# Patient Record
Sex: Male | Born: 1970
Health system: Southern US, Community
[De-identification: ages and names within clinical notes are randomized; demographics above are authoritative.]

## PROBLEM LIST (undated history)

## (undated) DIAGNOSIS — F419 Anxiety disorder, unspecified: Secondary | ICD-10-CM

## (undated) DIAGNOSIS — M1990 Unspecified osteoarthritis, unspecified site: Secondary | ICD-10-CM

## (undated) DIAGNOSIS — K5792 Diverticulitis of intestine, part unspecified, without perforation or abscess without bleeding: Secondary | ICD-10-CM

## (undated) DIAGNOSIS — I10 Essential (primary) hypertension: Secondary | ICD-10-CM

## (undated) DIAGNOSIS — K219 Gastro-esophageal reflux disease without esophagitis: Secondary | ICD-10-CM

## (undated) HISTORY — PX: KNEE SURGERY: SHX244

## (undated) HISTORY — PX: TONSILLECTOMY: SUR1361

## (undated) HISTORY — PX: BACK SURGERY: SHX140

## (undated) HISTORY — PX: SPINAL FUSION: SHX223

---

## 2001-01-13 ENCOUNTER — Emergency Department (HOSPITAL_COMMUNITY): Admission: EM | Admit: 2001-01-13 | Discharge: 2001-01-13 | Payer: Self-pay | Admitting: Emergency Medicine

## 2001-08-29 ENCOUNTER — Encounter: Payer: Self-pay | Admitting: Internal Medicine

## 2001-08-29 ENCOUNTER — Inpatient Hospital Stay (HOSPITAL_COMMUNITY): Admission: EM | Admit: 2001-08-29 | Discharge: 2001-09-01 | Payer: Self-pay | Admitting: Emergency Medicine

## 2001-08-31 ENCOUNTER — Encounter: Payer: Self-pay | Admitting: Internal Medicine

## 2001-08-31 ENCOUNTER — Encounter: Payer: Self-pay | Admitting: Geriatric Medicine

## 2002-09-18 HISTORY — PX: NASAL SEPTUM SURGERY: SHX37

## 2003-04-06 ENCOUNTER — Encounter: Payer: Self-pay | Admitting: Emergency Medicine

## 2003-04-07 ENCOUNTER — Inpatient Hospital Stay (HOSPITAL_COMMUNITY): Admission: EM | Admit: 2003-04-07 | Discharge: 2003-04-13 | Payer: Self-pay | Admitting: Psychiatry

## 2003-04-14 ENCOUNTER — Other Ambulatory Visit (HOSPITAL_COMMUNITY): Admission: RE | Admit: 2003-04-14 | Discharge: 2003-05-05 | Payer: Self-pay | Admitting: Psychiatry

## 2004-06-09 ENCOUNTER — Emergency Department (HOSPITAL_COMMUNITY): Admission: EM | Admit: 2004-06-09 | Discharge: 2004-06-10 | Payer: Self-pay | Admitting: Emergency Medicine

## 2004-06-12 ENCOUNTER — Emergency Department (HOSPITAL_COMMUNITY): Admission: EM | Admit: 2004-06-12 | Discharge: 2004-06-12 | Payer: Self-pay | Admitting: Emergency Medicine

## 2004-11-16 ENCOUNTER — Inpatient Hospital Stay (HOSPITAL_COMMUNITY): Admission: RE | Admit: 2004-11-16 | Discharge: 2004-11-18 | Payer: Self-pay | Admitting: Neurosurgery

## 2005-01-16 ENCOUNTER — Ambulatory Visit (HOSPITAL_BASED_OUTPATIENT_CLINIC_OR_DEPARTMENT_OTHER): Admission: RE | Admit: 2005-01-16 | Discharge: 2005-01-16 | Payer: Self-pay | Admitting: Urology

## 2005-01-23 ENCOUNTER — Inpatient Hospital Stay (HOSPITAL_COMMUNITY): Admission: EM | Admit: 2005-01-23 | Discharge: 2005-01-25 | Payer: Self-pay | Admitting: Urology

## 2006-05-25 ENCOUNTER — Encounter: Admission: RE | Admit: 2006-05-25 | Discharge: 2006-05-25 | Payer: Self-pay | Admitting: Neurosurgery

## 2007-11-21 ENCOUNTER — Emergency Department (HOSPITAL_COMMUNITY): Admission: EM | Admit: 2007-11-21 | Discharge: 2007-11-21 | Payer: Self-pay | Admitting: Emergency Medicine

## 2008-07-22 ENCOUNTER — Ambulatory Visit (HOSPITAL_BASED_OUTPATIENT_CLINIC_OR_DEPARTMENT_OTHER): Admission: RE | Admit: 2008-07-22 | Discharge: 2008-07-22 | Payer: Self-pay | Admitting: Orthopedic Surgery

## 2008-10-31 ENCOUNTER — Emergency Department (HOSPITAL_COMMUNITY): Admission: EM | Admit: 2008-10-31 | Discharge: 2008-10-31 | Payer: Self-pay | Admitting: Emergency Medicine

## 2009-03-05 ENCOUNTER — Encounter: Admission: RE | Admit: 2009-03-05 | Discharge: 2009-03-05 | Payer: Self-pay | Admitting: Internal Medicine

## 2009-08-11 ENCOUNTER — Encounter: Admission: RE | Admit: 2009-08-11 | Discharge: 2009-08-11 | Payer: Self-pay | Admitting: Internal Medicine

## 2009-08-20 ENCOUNTER — Encounter: Admission: RE | Admit: 2009-08-20 | Discharge: 2009-08-20 | Payer: Self-pay | Admitting: Internal Medicine

## 2010-03-14 ENCOUNTER — Emergency Department (HOSPITAL_COMMUNITY): Admission: EM | Admit: 2010-03-14 | Discharge: 2010-03-15 | Payer: Self-pay | Admitting: Emergency Medicine

## 2010-10-09 ENCOUNTER — Encounter: Payer: Self-pay | Admitting: Neurosurgery

## 2010-12-04 LAB — RAPID STREP SCREEN (MED CTR MEBANE ONLY): Streptococcus, Group A Screen (Direct): NEGATIVE

## 2011-01-31 NOTE — Op Note (Signed)
NAME:  Kenneth [name], Kenneth [name]                  ACCOUNT NO.:  [id number]   MEDICAL RECORD NO.:  [id number]          PATIENT TYPE:  AMB   LOCATION:  DSC                          FACILITY:  [facility]   PHYSICIAN:  [name], M.D. DATE OF BIRTH:  1971-03-19   DATE OF PROCEDURE:  07/22/2008  DATE OF DISCHARGE:                               OPERATIVE REPORT   PREOPERATIVE DIAGNOSIS:  Left knee medial meniscus tear.   POSTOPERATIVE DIAGNOSIS:  Left knee medial meniscus tear with some grade  2 fissuring lateral tibial plateau.   PROCEDURE:  Left knee exam under anesthesia, arthroscopy, partial medial  meniscectomy, chondroplasty of the lateral tibial plateau.   SURGEON:  [name], MD   ASSISTANT:  Genene Churn. Barry Dienes, [place]   ANESTHESIA:  Knee block with sedation.   SPECIMENS:  None.   CULTURES:  None.   COMPLICATIONS:  None.   DRESSINGS:  Soft compressive.   TOURNIQUET:  None applied.   PROCEDURE IN DETAIL:  The patient brought to the operating room, placed  on the operating table in supine position.  After adequate anesthesia  has been obtained, tourniquet and leg holder applied.  Leg prepped and  draped in usual sterile fashion.  Three portals were created; one  superolateral, one each medial and lateral parapatellar.  Inflow  catheter induced.  The standard arthroscope was induced and knee  inspected.  Some mild degree of grade 1 and 2 changes peak of the  patella.  Good tracking.  Trochlea looked good.  Cruciate ligaments  intact.  Lateral meniscus intact.  Some fissuring on the plateau with  little chondral irregularity with chondroplasty.  The condyle looked  good.  Medial meniscus with extensive complex tearing.  Posterior horn.  Posterior horn removed to stable rim.  Tapered into remaining meniscus.  The entire knee examined.  All the findings appreciated.  Instruments  and  fluid removed.  Portals and knee were injected with Marcaine.  Portals  closed with 4-0 nylon.   Sterile compressive dressing applied.  Anesthesia reversed.  Brought to the recovery room.  Tolerated the  surgery well.  No complications.      [name], M.D.  Electronically Signed     DFM/MEDQ  D:  07/22/2008  T:  07/23/2008  Job:  [id number]

## 2011-02-03 NOTE — Discharge Summary (Signed)
[place]. [facility]  Patient:    Kenneth [name], Kenneth [name] Visit Number: [id number] MRN: [id number]          Service Type: MED Location: 3000 [id number] Attending Physician:  Jerl Santos Dictated by:   Jerl Santos, M.D. Admit Date:  08/29/2001 Discharge Date: 09/01/2001                             Discharge Summary  HISTORY OF PRESENT ILLNESS:  Kenneth [name] is a 40 year old man who presented to the emergency room on 08/29/01, with headache, dizziness, indigestion, and marked acid reflux.  The patient also reported that over the course of the 24 hours prior to admission he had developed left arm and left leg numbness and weakness associated with a tingling sensation and dizziness.  He reported to his wife that he could not get out of bed.  In the emergency room, the patient was given intravenous fluids, as well as a morphine injection for headache. After several hours of IV hydration the patient reported he still could not get out of bed or care for himself, so it was felt to be prudent to admit him to the hospital.  PHYSICAL EXAMINATION:  GENERAL:  An anxious gentleman who was reporting a feeling of numbness at that time in his left arm.  HEENT:  Within normal limits.  NECK:  Supple.  CHEST:  Clear.  CARDIOVASCULAR:  Normal S1 and S2, without murmurs, rubs, or gallops.  ABDOMEN:  Benign.  NEUROLOGIC:  The patient showed subjective evidence of significant weakness of the left upper and lower extremities which rated 4/5 in strength diffusely. He is able to perform finger-nose-finger testing, and reports a subjective sense of numbness in his arm and leg.  LABORATORY DATA:  Blood sugar of 106, normal electrolytes.  BUN was 13. Hemoglobin was 17.  Liver profile was within normal limits.  TSH 1.3. Urinalysis was normal.  Urine culture was negative.  Blood cultures were negative.  The patient underwent a CT scan of the chest which was negative  for pulmonary emboli.  An EKG was within normal limits.  A MRI scan of the brain was done which was normal.  MR angiography was also done which was normal.  HOSPITAL COURSE:  The patients course was one of gradual improvement with IV hydration, headache, nausea, and dizziness seemed to resolve.  Left-sided weakness seemed to resolve to.  On 09/01/01, it was felt to be prudent to go ahead and discharge the patient.  DISCHARGE MEDICATIONS: 1. Toprol XL 100 mg q.d. 2. Xanax 0.25 mg q.8h. p.r.n. anxiety. 3. Protonix 40 mg q.d.  FOLLOWUP:  The patient was instructed to call [phone] to arrange for a return appointment with Dr. Kevan Ny.  DISCHARGE DIAGNOSES: 1. Probable gastroenteritis. 2. Subjective numbness and weakness of the left arm and leg of uncertain    etiology. 3. History of chronic headaches. 4. History of panic disorder. 5. History of lumbar laminectomy. 6. History of atypical chest pain. Dictated by:   Jerl Santos, M.D. Attending Physician:  Jerl Santos DD:  09/14/01 TD:  09/15/01 Job: [id number]

## 2011-02-03 NOTE — Discharge Summary (Signed)
NAME:  Kenneth [name], Kenneth [name]                            ACCOUNT NO.:  [id number]   MEDICAL RECORD NO.:  [id number]                   PATIENT TYPE:  IPS   LOCATION:  0507                                 FACILITY:  [facility]   PHYSICIAN:  Geoffery Lyons, M.D.                   DATE OF BIRTH:  09/04/1970   DATE OF ADMISSION:  04/07/2003  DATE OF DISCHARGE:  04/13/2003                                 DISCHARGE SUMMARY   CHIEF COMPLAINT AND PRESENT ILLNESS:  This was the first admission to [facility] for this 40 year old married white male voluntarily  admitted.  History of depression, feeling very worthless, playing with  alcohol, drank a beer then whiskey I love beer and whiskey.  The patient  usually drinks alone.  Feels very guilty.  Began drinking during his lunch  hour, drinking for several hours after work.  Failed to stop on his own but  was having withdrawal.  When he was drinking, he did not care anymore.  Conflict with his wife.   PAST PSYCHIATRIC HISTORY:  First time at [facility].  No other  psychiatric treatment.   ALCOHOL/DRUG HISTORY:  As already stated, been drinking everyday since  March.  History of blackouts while drinking alone and had been starting  early.  Also reports memory loss.   PAST MEDICAL HISTORY:  Hypertension.   MEDICATIONS:  Toprol XL 100 mg daily (has been noncompliant), Klonopin 1 mg  in the morning.   PHYSICAL EXAMINATION:  Performed and failed to show any acute findings.   MENTAL STATUS EXAM:  Alert, oriented, cooperative male casually dressed.  Good eye contact.  Speech is clear.  Mood is depressed.  Affect is broad but  becomes anxious, tearful, crying at times.  Thought processes are positive  for feeling overwhelmed, worrying, cannot stop worrying, feeling guilty.  Cognition well-preserved.   ADMISSION DIAGNOSES:   AXIS I:  1. Alcohol dependence.  2. Depressive disorder not otherwise specified.  3. Generalized anxiety  disorder.   AXIS II:  No diagnosis.   AXIS III:  Hypertension.   AXIS IV:  Moderate.   AXIS V:  Global Assessment of Functioning upon admission 25; highest Global  Assessment of Functioning in the last year 65.   LABORATORY DATA:  Blood chemistry within normal limits.  Thyroid profile  within normal limits.  CBC within normal limits.   HOSPITAL COURSE:  He was admitted and started intensive individual and group  psychotherapy.  He was detoxified with Librium.  Initially used Zoloft 25 mg  per day and Risperdal 0.5 mg at night.  Zoloft was increased to 50 mg.  He  continued Librium, Toprol XL 100 mg daily, __________  0.1 mg on a p.r.n.  basis.  He was also placed on ReVia 25 mg per day to increase to 50 mg.  He  did endorse persistent  Formby-term depression with chronic marital conflict.  Increased use of alcohol with loss of control.  Starting to affect his  functioning and his judgment.  Dedicated to his daughters but in a very  dysfunctional relationship with his wife.  A lot of issues of self-image,  self-esteem.  The detoxification went uneventfully but experienced episodes  of arterial hypertension as well as severe anxiety.  Could not see himself  as able to make it, a lot of self-doubt.  As he continued to be detoxed, he  was able to get in touch with feelings that he had not been able to do  before.  A lot of cravings.  That is when he was started on ReVia.  On April 13, 2003, he was in full contact with reality.  Anxious, anticipating  discharge, yet understanding that he needed to go and do what he knew to do  to stay sober.  No suicidal ideation.  No homicidal ideation.  Will go and  stay with friends for a little while and come to [facility].  He was given some  Seroquel 25 mg every six hours for anxiety.   DISCHARGE DIAGNOSES:   AXIS I:  1. Depressive disorder not otherwise specified.  2. Generalized anxiety disorder.  3. Alcohol dependence.   AXIS II:  No  diagnosis.   AXIS III:  Arterial hypertension.   AXIS IV:  Moderate.   AXIS V:  Global Assessment of Functioning upon discharge 50.   DISCHARGE MEDICATIONS:  1. Zoloft 50 mg per day.  2. ReVia 50 mg per day.  3. Seroquel 25 mg twice a day as needed for anxiety and 2 at bedtime.  4. Toprol XL 100 mg, 1-1/2 daily.   FOLLOW UP:  [facility].                                               Geoffery Lyons, M.D.    IL/MEDQ  D:  05/20/2003  T:  05/21/2003  Job:  [id number]

## 2011-02-03 NOTE — Op Note (Signed)
NAME:  Kenneth [name], Kenneth [name]                  ACCOUNT NO.:  [id number]   MEDICAL RECORD NO.:  [id number]          PATIENT TYPE:  OIB   LOCATION:  3041                         FACILITY:  [facility]   PHYSICIAN:  Cristi Loron, M.D.DATE OF BIRTH:  08-08-1971   DATE OF PROCEDURE:  11/16/2004  DATE OF DISCHARGE:                                 OPERATIVE REPORT   BRIEF HISTORY:  The patient is a 40 year old white male who has suffered  from a Lehrmann history of back and left leg pain.  He underwent a prior surgery  by another surgeon at L4-5, which provided temporary relief.  I discussed  the various treatment options with the patient including surgery.  The  patient has weighed the risks, benefits and alternatives of surgery, and  would like to proceed with an L4-5 fusion with a diskectomy at L3-4.   PREOPERATIVE DIAGNOSES:  L4-5 degenerative disk disease, stenosis, L3-4,  herniated nucleus pulposus.   POSTOPERATIVE DIAGNOSIS:  L4-5 degenerative disk disease, stenosis, L3-4,  herniated nucleus pulposus.   OPERATION PERFORMED:  L4 redo laminectomy; L4-5 posterior lumbar interbody  fusion; insertion of bilateral L4-5 interbody prosthesis (Capstone PEEK  cages); L4-5 posterior noncemented instrumentation with Legacy titanium  pedicle screws and rods;  L4-5 posterolateral arthrodesis with local morselized autograft bone and  Actifuse bone substitute; left L3-4 far lateral microdiskectomy using  microdissection.   SURGEON:  Cristi Loron, M.D.   ASSISTANT:  Hewitt Shorts, M.D.   ANESTHESIA:  General endotracheal.   ESTIMATED BLOOD LOSS:  The estimated blood loss is 400 mL.   SPECIMENS:  None.   DRAINS:  None.   COMPLICATIONS:  None.   DESCRIPTION OF OPERATION:  The patient was brought to the operating room by  the anesthesia team.  General endotracheal anesthesia was induced. The  patient was turned to the prone position on the Wilson frame.  His  lumbosacral region was then  prepared with Betadine scrub and Betadine  solution.  Sterile drapes were applied.  I then injected the area to be  incised with Marcaine with epinephrine solution.  I used a scalpel to make a  midline incision, ellipsing out the patient's prior surgical scar.  I  extended the incision both in the cephalad and the caudal direction.  I then  used electrocautery to perform a subperiosteal dissection.  I exposed the  spinous process and lamina of L3, 4 and 5.  I then obtained intraoperative  radiograph to confirm our location.  I began by incising the interspinous  ligament at L3-4 and L4-5 with the scalpel.  I used the posterior rongeur to  remove the L4 spinous process and part of the L4 lamina.  We saved this bone  and later cleared it of soft tissue, and morselized it to be used in the  fusion process. I had to use the high-speed drill to perform bilateral L4  laminotomies.  We encountered the expected amount of epidural scar tissue at  L4-5 from his prior diskectomy.  I dissected through the epidural scar  tissue and then removed it with  the Kerrison punch exposing the thecal sac  at L4-5.  We completed the L4 laminectomy with the Kerrison punches,  removing the L3-4 ligamentum flavum and the L4-5 ligamentum flavum.  We had  a good decompression of the thecal sac and performed a foraminotomy about  the bilateral L4 and L5 nerve roots, completing the decompression at this  level.   We now turned our attention to the posterolateral interbody fusion.  We  freed up the thecal sac and nerve roots from the epidural scar tissue, and  then carefully retracted the neural structures medially with a  D'Errico  retractor.  We then incised the L4-5 intervertebral disk with a 15 blade  scalpel.  We performed an aggressive bilateral diskectomy using the  pituitary forceps and the Epstein and Scoville curets.   We then prepared the vertebral endplates at L4-5 by dissecting the  contralateral disk  space with the interbody spreader, and then cleared off  the soft tissues from the vertebral endplates on the ipsilateral side using  the curets.  We then inserted a 10 x 26 mm Capstone PEEK cage, which had  been refilled with Actifuse bone graft substitute and local morselized  autograft bone.  It was inserted into the tract at L4-5 interspace, of  course after retracting the neural structures out of harm's way.  We then  filled medially it with a combination of local morselized autograft bone and  Actifuse bone graft substitute, and then placed a second Capstone PEEK cage  on the contralateral side.  I completed the posterior lumbar interbody  fusion.   We now turned our attention to the posterior noncemented instrumentation.  We used electrocautery to expose the mild transverse process at L4 and L5.  We then, under fluoroscopic guidance, cannulated the bilateral L4 and L5  pedicles with the bone probes, and then tapped the pedicles.  We tapped the  pedicles with a 5.5 mm tap.  We then probed the slightly tapped pedicles  with the ball probe and noted there were no cortical breaches. We then  inserted a 6.5 x 8.45 mm pedicle screws bilaterally at L4 and L5 under  fluoroscopic guidance.  We then palpated along with the medial aspect of the  L4 and L5 pedicles, and noted there were no cortical breaches and the L4 and  L5 nerve roots were not injured. We then connected the unilateral pedicles  with a lordotic rod.  We secured the rod in place and tightened the caps  appropriately, completing the instrumentation.   We now turned our attention to the posterolateral fusion.  We used a high-  speed drill to decorticate the remainder of the L4-5 facet and pars region  and transverse process, and they will be later used as a combination of  local versus autograft bone, and Actifuse bone graft substitute over these decorticated posterolateral structures completing the posterolateral  arthrodesis  at L4-5.   We now turned our attention to the L3-4 far lateral diskectomy.  Under the  magnification illumination microscope, we exposed the left L3 pars region.  We used a high speed drill to remove the lateral aspect of the left L3 pars  and then we dissected through the annular transverse ligament.  We removed  the ligament with Kerrison punch and then dissected through the  intertransverse muscle, identified the exiting at the L3 nerve foot.  We  used microdissection to free up the nerve root from the epidural tissue and  then we started underneath  the nerve root with the coronary dilator, and  noted there was a moderate size disk herniation out laterally, i.e. fall-out  disk herniation.  We carefully retracted the nerve down laterally with a  D'Errico retractor and then used a 15  blade scalpel to incise into the disk  herniation.  We then removed it in multiple fragments using the pituitary  forceps.  After we were satisfied with the intervertebral diskectomy, we  used the osteophyte tool to remove some ventral spondylosis at L3-4, further  decompressing the nerve root.  We the palpated along the exit routeof the L3  nerve root and it was well decompressed from the anterior spinal portion all  the way out to the soft tissues.  This completed the decompression at this  level.   We then obtained stringent hemostasis using bipolar electrocautery.  We  chose to irrigate the wound out  bacitracin solution.  We removed the  solution and then removed the Versatrac retractor.  We then reapproximated  the patient's thoracolumbar fascia with interrupted #1 Vicryl suture,  subcutaneous tissue with interrupted 2-0 Vicryl suture and the skin with  Steri-strips and Benzoin.  The wound was then coated with bacitracin  ointment.  At sterile dressing was applied and the drapes were removed.   The patient was subsequently returned to the supine position where he was  extubated by the anesthesia  team and transported to the postanesthesia care  unit in stable condition.  All sponge, instrument and needle counts were  correct at the end of this case.      JDJ/MEDQ  D:  11/16/2004  T:  11/17/2004  Job:  [id number]

## 2011-02-03 NOTE — H&P (Signed)
NAME:  Kenneth [name], Kenneth [name]                            ACCOUNT NO.:  [id number]   MEDICAL RECORD NO.:  [id number]                   PATIENT TYPE:  IPS   LOCATION:  0507                                 [facility]ILITY[facility]  [facility]   PHYSICIAN:  Geoffery Lyons, M.D.                   DATE OF BIRTH:  09/04/1970   DATE OF ADMISSION:  04/07/2003  DATE OF DISCHARGE:                         PSYCHIATRIC ADMISSION ASSESSMENT   IDENTIFYING INFORMATION:  A 40 year old married white male, voluntarily  admitted on April 07, 2003.   HISTORY OF PRESENT ILLNESS:  The patient presents with a history of  depression, feeling very worthless.  He has been having problems with  alcohol.  First he drank beer, then whiskey, and now drinks beer and  whiskey.  He usually drinks alone.  He feels very guilty.  He also has begun  to drink at work during his lunch hour, drinking for several hours after  work.  He states he has tried to stop on his own but is having withdrawal  symptoms.  He states he has no motivation.  When he drinks he does not care  anymore.  He has been having conflicts with his wife.  He is having some  paranoid ideation, feels like he is under a microscope.  He feels that  people can see through him.  His sleep has been decreased.  He denies any  visual hallucinations.   PAST PSYCHIATRIC HISTORY:  First hospitalization at [facility], no other psychiatric admissions, no outpatient treatment.   SOCIAL HISTORY:  He is a 40 year old married white male, married for 10  years, 2 children ages 66 and 69.  He lives with his wife and children.  He  states there is a lot of ongoing conflict.  He and his wife have been  sleeping apart for the past 3 years.  The patient works in a tobacco  company.  Reports no legal problems.   FAMILY HISTORY:  On his mother's side, alcoholism with the uncle and  grandfather.   ALCOHOL DRUG HISTORY:  Nonsmoker.  Has been drinking every day since March.  His last drink was  on April 06, 2003 at 5:30 p.m.  He has a history of  blackouts.  He reports memory loss from his drinking.  No seizure activity.  Denies any drug use.   PAST MEDICAL HISTORY:  Primary care provider is Dr. Kevan Ny at [facility].  Medical problems are hypertension.   MEDICATIONS:  Toprol XL 100 mg a day.  He has been noncompliant.  Also  ordered Klonopin 1 mg q.a.m., has been on that for approximately 1 year.   DRUG ALLERGIES:  No known allergies.   PHYSICAL EXAMINATION:  Done at [facility].  The patient was having  headaches while he was there.  They did a CT scan which was negative.  His  alcohol level  was 136.  Urine drug screen was negative.  His blood pressure  today is 152/102.  The patient is anxious and his face is somewhat flushed,  otherwise in no acute distress.  Not having any tremors at this time.  He  does complain of feeling nauseated.   MENTAL STATUS EXAM:  He is an alert, oriented, cooperative male, casually  dressed, good eye contact.  Speech is clear, mood is depressed, affect is  the patient is very polite, anxious, tearful, crying at times.  Thought  processes are positive paranoia.  The patient worrying over guilt and what  he has done to his family and thinking he has not done enough for them.  Cognitive function intact.  Memory is good, judgment is fair, insight is  fair.  The patient appears very sincere.   ADMISSION DIAGNOSES:   AXIS I:  1. 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  2. Alcohol abuse rule out dependence.   AXIS II:  Deferred.   AXIS III:  Hypertension.   AXIS IV:  Problems with primary support group, other psychosocial problems.   AXIS V:  Current is 25, estimated this past year 32.   PLAN:  Voluntary admission for alcohol, depression and psychotic symptoms.  Contract for safety, check every 15 minutes.  Stabilize mood and thinking.  We will initiate the low-dose Librium to detox safely.  Begin Zoloft to   decrease depressive symptoms and symptoms of anxiety.  Risperdal will be  available for psychotic symptoms.  We will attempt a family session with his  wife.  Medication compliance was discussed with the patient.  The patient is  to follow up with mental health and consider individual therapy, to attend  AA meetings after discharge.  The patient is to remain alcohol free.   TENTATIVE LENGTH OF CARE:  4-5 days or more depending on patient's response  to medication.      Landry Corporal, N.P.                       Geoffery Lyons, M.D.    JO/MEDQ  D:  04/07/2003  T:  04/07/2003  Job:  [id number]

## 2011-02-03 NOTE — H&P (Signed)
[place]  Patient:    LEBRON, NAUERT Visit Number: [id number] MRN: [id number]          Service Type: MED Location: [id number] Attending Physician:  Jerl Santos Dictated by:   Jerl Santos, M.D. Admit Date:  08/29/2001                           History and Physical  CHIEF COMPLAINT: Kenneth [name]. [place] is a 40 year old man, who is under the care of Dr. Robley Fries.  Mr. Rout presented to the emergency room today with a history of having awakened this morning at about 5 a.m. with headache and dizziness.  HISTORY OF PRESENT ILLNESS: At the same time he noticed some increased indigestion and a feeling of acid reflux.  Subsequently, he became increasingly nauseated and experienced vomiting and diarrhea.  During the course of the day he was caring for the children while his wife worked, and he noticed that the left arm and left leg felt numb and felt slightly weak.  He described this as a tingling feeling.  Because of dizziness and because of this numbness he was unable to get out of bed.  His wife contacted the office in regard to these complaints and he was advised to come to the emergency room.  In the emergency room he was treated with morphine and Phenergan and was given intravenous fluids.  In spit of several hours of IV hydration he continued to experience headache and reported that he was unable to get out of bed.  For this reason it was felt to be prudent to admit him to the hospital.  CURRENT MEDICATIONS:  1. Toprol XL 100 mg q.d.  2. Motrin 800 mg p.r.n. for headache.  3. Xanax 0.25 mg p.r.n.  He has taken Paxil in the past but is not taking it at present.  PAST SURGICAL HISTORY: He had a back operation in 1998, which apparently was an L4-5 laminectomy.  PAST MEDICAL HISTORY:  1. The patient has a history of chronic headaches, which he states have been     attributed to high blood pressure.  He generally medicates these by  taking     Motrin or Goody Powders.  He states the headaches occur about once a month     and are described as bitemporal and throbbing in nature, generally     associated with nausea.  2. He also reports that he has episodic chest pains.  These chest pains occur     in a manner that is unrelated to exertion.  He states that he had a stress     test done in June 2002 to evaluate this and this turned out negative.  FAMILY HISTORY: The patient really does not know much about his father.  His mother has a history of hypertension and chronic fatigue syndrome.  He has one stepbrother, who is in good health.  ALLERGIES: None known.  REVIEW OF SYSTEMS: HEAD: See above.  EYES: He denies diplopia.  EARS/NOSE/ THROAT: He denies earache, sinus pain, or sore throat.  CHEST: He denies coughing, wheezing, or chest congestion.  CARDIOVASCULAR: He denies orthopnea, PND, or ankle edema.  GI: See above.  There has been no melena or hematochezia.  GU: He denies dysuria, urinary frequency, hesitancy, or nocturia.  RHEUMATOLOGIC: He denies back pain or joint pain.  HEMATOLOGIC: He denies easy bleeding or bruising.  NEUROLOGIC: He denies  history of seizure or stroke.  PHYSICAL EXAMINATION:  GENERAL: This is a quite anxious man, who is lying in bed reporting feeling of dizziness.  He states that he is experiencing a feeling of numbness in his left arm.  HEENT: There are no visible lesions on the head.  Extraocular movements reveal evidence of nystagmus, otherwise are full.  Tympanic membranes are clear. Nares patent.  Pharynx without erythema or exudate.  NECK: Supple.  CHEST: Clear to auscultation.  CARDIOVASCULAR: Normal S1 and S2.  No rubs, murmurs, or gallops.  ABDOMEN: Benign.  Normal bowel sounds.  No masses, tenderness, or organomegaly.  NEUROLOGIC: On neurologic testing, cranial nerves 2-12 are intact.  On motor examination the patient exhibits significant weakness of the left  upper extremity and left lower extremity.  I would say these are about 4/5 in strength.  This seems to be a diffuse finding.  He is able to perform finger-to-nose-to-finger testing.  He reports a subjective sense of numbness in his arm and leg.  He also reports difficulty urinating and, in fact, had to have a Foley catheter placed because of this.  IMPRESSION:  1. Acute onset of illness characterized by vomiting, diarrhea, and focal     weakness of the left arm and leg.  2. History of chronic headaches.  3. History of panic disorder.  4. History of lumbar laminectomy.  5. History of atypical chest pain, with negative cardiac work-up.  PLAN: I felt it was most prudent to admit the patient to the hospital for observation and evaluation.  I will administer intravenous fluids and monitor his metabolic profile closely.  We will treat his nausea with Phenergan and will also give him some Pepcid for indigestion symptoms.  He will be maintained on Xanax and Toprol as noted above.  I am going to obtain a CT scan of the brain to evaluate his unusual neurologic complaints, possibly neurology consultation may be of benefit, and ultimately an MRI scan may be warranted if his symptoms persist. Dictated by:   Jerl Santos, M.D. Attending Physician:  Jerl Santos DD:  08/29/01 TD:  08/30/01 Job: [id number]

## 2011-02-03 NOTE — H&P (Signed)
NAME:  Kenneth [name], Kenneth [name]                  ACCOUNT NO.:  [id number]   MEDICAL RECORD NO.:  [id number]          PATIENT TYPE:  INP   LOCATION:  0352                         FACILITY:  [facility]   PHYSICIAN:  Bertram Millard. Dahlstedt, M.D.DATE OF BIRTH:  [birth year]   DATE OF ADMISSION:  01/23/2005  DATE OF DISCHARGE:                                HISTORY & PHYSICAL   REASON FOR ADMISSION:  Right testicular pain.   BRIEF HISTORY:  This nice 40 year old gentleman underwent bilateral  vasovasostomy for azoospermia one week ago.  He began developing pain right  after the surgery, which was expected; however, he began developing  significant swelling, mainly on the left side, approximately four days ago.  He has had a hard time over the weekend.  He has had worsening of his  swelling and on Saturday developed a fever to 102.  He was put on Cipro.  He  presented to my office this morning, having a hard time walking and in  significant pain.  I evaluated the patient with an ultrasound of the  scrotum.  This revealed normal blood flow to both testicles but a  significant left hemiscrotal fluid swelling, most likely a hematoma.  At  this point, the patient has been managed at home unsuccessfully with  Percocet.  He is admitted at this time for local scrotal measures and pain  management.   PAST MEDICAL HISTORY:  Bilateral vasovasostomy last week.  He underwent  vasectomy about three years ago.  He underwent treatment for panic disorders  in July, 2004.  He underwent L4-5 fusion earlier this year.  He underwent  laminectomy in 1998.  He has a history of hypertension, GERD.  He has been  treated for depression.   MEDICATIONS:  1.  Toprol XL 50 mg a day.  2.  Diovan 160 mg daily.  3.  Nexium 40 mg a day.  4.  Cipro 500 mg b.i.d.  5.  Percocet and Lexapro 10 mg daily.   No known drug allergies.   The patient is married for the second time.  He does not use tobacco.  He  does not currently drink.   FAMILY HISTORY:  Noncontributory.   REVIEW OF SYSTEMS:  The patient has had some difficulty voiding due to some  scrotal swelling.  He thinks he has been  passing some blood in his urine.  The left scrotal pain radiates into his left inguinal region.   PHYSICAL EXAMINATION:  VITAL SIGNS:  See chart.  GENERAL:  A pleasant but quite uncomfortable adult male.  HEENT:  Normal.  NECK:  Supple.  LUNGS:  Clear.  HEART:  Regular rate and rhythm.  ABDOMEN:  Protuberant, soft, nontender, nondistended.  No masses or megaly.  Bladder not palpable.  GU:  Phallus circumcised.  No lesions, fibrotic areas or plaques.  Glans  normal.  Meatus normal in location and size.  Left hemiscrotum significantly  swollen and ecchymotic.  Right hemiscrotum somewhat ecchymotic but not that  swollen.  The incision was healing well.   Scrotal ultrasound dictated previously showed adequate flow and  left  hemiscrotal swelling.   IMPRESSION:  1.  Left scrotal hematoma.  2.  Left scrotal pain.  3.  Status post bilateral vasovasostomy with left scrotal hematoma.   PLAN:  1.  Admit for IV antibiotics, pain management.  2.  Will place heating pad to the left hemiscrotum and give scrotal support.      SMD/MEDQ  D:  01/23/2005  T:  01/23/2005  Job:  [id number]

## 2011-02-03 NOTE — Op Note (Signed)
NAME:  Kenneth [name], Kenneth [name]                  ACCOUNT NO.:  [id number]   MEDICAL RECORD NO.:  [id number]          PATIENT TYPE:  AMB   LOCATION:  NESC                         FACILITY:  [facility]   PHYSICIAN:  Bertram Millard. Dahlstedt, M.D.DATE OF BIRTH:  04-09-1971   DATE OF PROCEDURE:  01/16/2005  DATE OF DISCHARGE:                                 OPERATIVE REPORT   PREOPERATIVE DIAGNOSIS:  Status post bilateral vasectomy with azoospermia.   POSTOPERATIVE DIAGNOSIS:  Status post bilateral vasectomy with azoospermia.   PROCEDURE:  Bilateral microscopic vasovasostomy.   SURGEON:  Bertram Millard. Dahlstedt, M.D.   ANESTHESIA:  General.   COMPLICATIONS:  None.   BRIEF HISTORY:  This nice gentleman recently presented in my office three  years after vasectomy. He was remarried recently. He and his wife desire to  have children.   Evaluation revealed the patient had minimal scarring around his vas, and he  was found to be azoospermia. He presents at this time for microscopic  bilateral vasovasostomy.  He is aware of the risks and complications and  desires to proceed.   DESCRIPTION OF PROCEDURE:  The patient was administered preoperative IV  antibiotics.  He was taken to the operating room where general anesthetic  was administered. He was placed in the supine position. Genitalia and  perineum were prepped and draped. Penis was retracted superiorly. A 3 cm  incision was made in the anterior scrotum along the superior aspect of the  median raphe and carried down to the tunica vaginalis bilaterally. The vasa  were dissected free bilaterally. The scarring/nodularity was identified  easily on both vas deferens. The vasa were dissected proximally and distally  to these scarred areas. The scar was then excised bilaterally. The open ends  of the vasa were then easily dilated to #2 lacrimal duct probe size.  There  was a large amount of fluid, milky white, expressed from the proximal end of  both vasa. At this  point the microscopic vasovasostomy was performed. They  were performed in the same way bilaterally. The vas clamp was applied to the  proximal and distal ends. 9-0 nylon was then used to perform full-thickness  sutures through the proximal and distal vasa in a 120 degree fashion such  that they were placed triangularly. Once these were all placed, they were  tied. In between, the seromuscular layers were reapproximated using the same  9-0 nylons. These were placed such that there were six sutures in each vas,  three full-thickness and then three seromuscular on each end. Both sides, as  dictated previously, were anastomosed the same. At this point both cords  were infiltrated with 5 cc each of quarter percent plain Marcaine. Both  testicles were returned to their hemiscrotum. The dartos tissue was  reapproximated using a running 3-0 chromic. The skin edges were approximated  using 4-0 chromic placed in the subcuticular fashion. A Tegaderm was placed.  Fluffs were then placed under a scrotal support.   The patient tolerated procedure well. Awakened and taken to PACU in stable  condition. Sponge, needle, instrument counts were correct  x2.   The patient will follow-up in approximately two weeks.  He has a  prescription for Percocet at home. He was given postoperative scrotal  procedure instruction sheet.      SMD/MEDQ  D:  01/16/2005  T:  01/16/2005  Job:  [id number]

## 2011-02-03 NOTE — Discharge Summary (Signed)
NAME:  [name], Kenneth                  ACCOUNT NO.:  [id number]   MEDICAL RECORD NO.:  [id number]          PATIENT TYPE:  INP   LOCATION:  3041                         FACILITY:  [facility]   PHYSICIAN:  Cristi Loron, M.D.DATE OF BIRTH:  1970-12-02   DATE OF ADMISSION:  11/16/2004  DATE OF DISCHARGE:  11/18/2004                                 DISCHARGE SUMMARY   BRIEF HISTORY:  The patient is a 40 year old white male who suffered from a  [place] history of back and left leg pain.  He underwent a prior lumbar surgery  by another physician at L4-5, which provided temporary relief.  He was  worked up with MRI, which demonstrated L4-5 degenerative disease and L3-4  herniated nucleus pulposus.  I discussed the various treatment options with  him, including surgery.  The patient weighed the risks, benefits and  alternatives to surgery and would like to proceed with an L4-5 fusion with a  diskectomy at L3-4.   For further details of this admission, please refer to the typed history and  physical.   HOSPITAL COURSE:  I admitted the patient to [facility] on November 16, 2004.  On the day of admission, I performed an L4-5 fusion with an L3-4  diskectomy.  The surgery went well (for full details of this operation,  please refer to the typed operative note).   POSTOPERATIVE COURSE:  The patient's postoperative course was essentially  unremarkable.  By postop day #2, i.e., November 18, 2004, the patient was  afebrile.  His vital signs were stable.  He was eating well and ambulating  well and was requesting discharge to home.  I, therefore, discharged him to  home on November 18, 2004.   DISCHARGE INSTRUCTIONS:  The patient was given written discharge  instructions.  Instructed to follow up with me in four weeks.   DISCHARGE PRESCRIPTIONS:  The patient was given a prescription for Percocet  and Valium p.r.n. for pain and muscle spasm, respectively.   FINAL DIAGNOSES:  1. L3-4 herniated nucleus  pulposus.  2. L4-5 degenerative disk disease with stenosis.     PROCEDURE PERFORMED:  L4 redo laminectomy; L4-5 posterior lumbar interbody  fusion; insertion of bilateral L4-5 interbody prosthesis ([employer]stone PEEK  cages); L4-5 posterior nonsegmental instrumentation with Legacy titanium  pedicle screws and rods; L4-5 posterolateral arthrodesis with local and  morcellized autograft bone and Actifuse bone graft extender; left L3-4 far  lateral microdiskectomy using microdissection.      JDJ/MEDQ  D:  02/02/2005  T:  02/02/2005  Job:  [id number]

## 2011-06-12 LAB — DIFFERENTIAL
Basophils Absolute: 0.1
Basophils Relative: 1
Eosinophils Absolute: 0.5
Eosinophils Relative: 6 — ABNORMAL HIGH
Lymphocytes Relative: 24
Lymphs Abs: 2.2
Monocytes Absolute: 0.8
Monocytes Relative: 9
Neutro Abs: 5.5
Neutrophils Relative %: 60

## 2011-06-12 LAB — I-STAT 8, (EC8 V) (CONVERTED LAB)
Acid-base deficit: 1
BUN: 11
Bicarbonate: 21.9
Chloride: 109
Glucose, Bld: 100 — ABNORMAL HIGH
HCT: 47
Hemoglobin: 16
Operator id: 285841
Potassium: 3.9
Sodium: 135
TCO2: 23
pCO2, Ven: 30.2 — ABNORMAL LOW
pH, Ven: 7.469 — ABNORMAL HIGH

## 2011-06-12 LAB — CBC
HCT: 44.4
Hemoglobin: 15.6
MCHC: 35
MCV: 96.6
Platelets: 279
RBC: 4.6
RDW: 12.5
WBC: 9

## 2011-06-12 LAB — D-DIMER, QUANTITATIVE: D-Dimer, Quant: <0.22

## 2011-06-12 LAB — POCT CARDIAC MARKERS
CKMB, poc: <1 — ABNORMAL LOW
Myoglobin, poc: 47.7
Operator id: 285841
Troponin i, poc: <0.05

## 2011-06-12 LAB — POCT I-STAT CREATININE
Creatinine, Ser: 1.1
Operator id: 285841

## 2011-06-12 LAB — OCCULT BLOOD X 1 CARD TO LAB, STOOL: Fecal Occult Bld: NEGATIVE

## 2011-06-20 LAB — BASIC METABOLIC PANEL
BUN: 8
CO2: 26
Calcium: 9.3
Chloride: 105
Creatinine, Ser: 0.78
GFR calc Af Amer: >60
GFR calc non Af Amer: >60
Glucose, Bld: 104 — ABNORMAL HIGH
Potassium: 4.1
Sodium: 138

## 2011-06-20 LAB — POCT HEMOGLOBIN-HEMACUE: Hemoglobin: 14.7

## 2011-09-18 ENCOUNTER — Emergency Department (HOSPITAL_COMMUNITY): Payer: 59

## 2011-09-18 ENCOUNTER — Other Ambulatory Visit: Payer: Self-pay

## 2011-09-18 ENCOUNTER — Emergency Department (HOSPITAL_COMMUNITY)
Admission: EM | Admit: 2011-09-18 | Discharge: 2011-09-18 | Disposition: A | Discharge status: Home / Self Care | Payer: 59 | Attending: Emergency Medicine | Admitting: Emergency Medicine

## 2011-09-18 DIAGNOSIS — I10 Essential (primary) hypertension: Secondary | ICD-10-CM | POA: Insufficient documentation

## 2011-09-18 DIAGNOSIS — F17200 Nicotine dependence, unspecified, uncomplicated: Secondary | ICD-10-CM | POA: Insufficient documentation

## 2011-09-18 DIAGNOSIS — R0989 Other specified symptoms and signs involving the circulatory and respiratory systems: Secondary | ICD-10-CM | POA: Insufficient documentation

## 2011-09-18 DIAGNOSIS — R0609 Other forms of dyspnea: Secondary | ICD-10-CM | POA: Insufficient documentation

## 2011-09-18 DIAGNOSIS — H538 Other visual disturbances: Secondary | ICD-10-CM | POA: Insufficient documentation

## 2011-09-18 DIAGNOSIS — R61 Generalized hyperhidrosis: Secondary | ICD-10-CM | POA: Insufficient documentation

## 2011-09-18 DIAGNOSIS — R0602 Shortness of breath: Secondary | ICD-10-CM | POA: Insufficient documentation

## 2011-09-18 DIAGNOSIS — R209 Unspecified disturbances of skin sensation: Secondary | ICD-10-CM | POA: Insufficient documentation

## 2011-09-18 DIAGNOSIS — R42 Dizziness and giddiness: Secondary | ICD-10-CM | POA: Insufficient documentation

## 2011-09-18 DIAGNOSIS — R51 Headache: Secondary | ICD-10-CM | POA: Insufficient documentation

## 2011-09-18 DIAGNOSIS — R5381 Other malaise: Secondary | ICD-10-CM | POA: Insufficient documentation

## 2011-09-18 HISTORY — DX: Essential (primary) hypertension: I10

## 2011-09-18 LAB — DIFFERENTIAL
Basophils Absolute: 0.1 10*3/uL (ref 0.0–0.1)
Basophils Relative: 1 % (ref 0–1)
Eosinophils Absolute: 0.6 10*3/uL (ref 0.0–0.7)
Eosinophils Relative: 5 % (ref 0–5)
Lymphocytes Relative: 37 % (ref 12–46)
Lymphs Abs: 3.8 10*3/uL (ref 0.7–4.0)
Monocytes Absolute: 0.7 10*3/uL (ref 0.1–1.0)
Monocytes Relative: 7 % (ref 3–12)
Neutro Abs: 5.2 10*3/uL (ref 1.7–7.7)
Neutrophils Relative %: 51 % (ref 43–77)

## 2011-09-18 LAB — TROPONIN I: Troponin I: <0.3 ng/mL (ref <0.30)

## 2011-09-18 LAB — CBC
HCT: 42.4 % (ref 39.0–52.0)
Hemoglobin: 14.6 g/dL (ref 13.0–17.0)
MCH: 32.4 pg (ref 26.0–34.0)
MCHC: 34.4 g/dL (ref 30.0–36.0)
MCV: 94.2 fL (ref 78.0–100.0)
Platelets: 230 10*3/uL (ref 150–400)
RBC: 4.5 MIL/uL (ref 4.22–5.81)
RDW: 12.2 % (ref 11.5–15.5)
WBC: 10.3 10*3/uL (ref 4.0–10.5)

## 2011-09-18 LAB — URINALYSIS, ROUTINE W REFLEX MICROSCOPIC
Bilirubin Urine: NEGATIVE
Glucose, UA: NEGATIVE mg/dL
Hgb urine dipstick: NEGATIVE
Ketones, ur: NEGATIVE mg/dL
Leukocytes, UA: NEGATIVE
Nitrite: NEGATIVE
Protein, ur: NEGATIVE mg/dL
Specific Gravity, Urine: 1.02 (ref 1.005–1.030)
Urobilinogen, UA: 0.2 mg/dL (ref 0.0–1.0)
pH: 5.5 (ref 5.0–8.0)

## 2011-09-18 LAB — BASIC METABOLIC PANEL
BUN: 11 mg/dL (ref 6–23)
CO2: 30 mEq/L (ref 19–32)
Calcium: 9.9 mg/dL (ref 8.4–10.5)
Chloride: 103 mEq/L (ref 96–112)
Creatinine, Ser: 0.94 mg/dL (ref 0.50–1.35)
GFR calc Af Amer: >90 mL/min (ref >90)
GFR calc non Af Amer: >90 mL/min (ref >90)
Glucose, Bld: 101 mg/dL — ABNORMAL HIGH (ref 70–99)
Potassium: 4.7 mEq/L (ref 3.5–5.1)
Sodium: 139 mEq/L (ref 135–145)

## 2011-09-18 MED ORDER — LISINOPRIL 10 MG PO TABS: 10.0000 mg | ORAL_TABLET | Freq: Every day | ORAL | Status: DC

## 2011-09-18 MED ORDER — HYDRALAZINE HCL 20 MG/ML IJ SOLN
INTRAMUSCULAR | Status: AC
  Administered 2011-09-18: 10 mg
  Filled 2011-09-18: qty 1

## 2011-09-18 MED ORDER — SODIUM CHLORIDE 0.9 % IV SOLN
Freq: Once | INTRAVENOUS | Status: AC
  Administered 2011-09-18: 18:00:00 via INTRAVENOUS

## 2011-09-18 NOTE — ED Notes (Signed)
Pt presents with dizziness, sweating, fatigue, SOB, blurred vision, intermittent confusion, and a "pulling sensation" in chest since Friday. Pt has Hx of "miny stroke". Per wife these symptoms have been going on for a while.

## 2011-09-18 NOTE — ED Notes (Signed)
Elevated bp, vague intermittent chest pain and headache.  Alert, skin warm and dry, sinus rhythm. Color good.  Had dizziness and visual disturbance earlier today.  Has been taking 1/2 of bp med for several mos.  Says the bp med makes him feel "washed out".  Alert, oriented .

## 2011-09-18 NOTE — ED Provider Notes (Signed)
History     CSN: [id number]  Arrival date & time 09/18/11  1700   First MD Initiated Contact with Patient 09/18/11 1821      Chief Complaint  Patient presents with  . Dizziness  . Shortness of Breath  . Excessive Sweating  . Blurred Vision  . Fatigue    (Consider location/radiation/quality/duration/timing/severity/associated sxs/prior treatment) HPI Comments: Patient is a pleasant 40 year old male with a history of hypertension for approximately 18 years taking a beta blocker, Toprol, as well as Nexium for acid reflux. He states that starting on Friday while taking down Christmas lights he began to have some confusion with blurred vision and a headache described as pressure in his head. This has been intermittent through the weekend, he is also noted increased tiredness, increased sleepiness and has slept for 12-14 hours at a time. In addition he has noted increased shortness of breath and dyspnea on exertion but has some shortness of breath at rest as well. He denies any fevers chills nausea vomiting coughing abdominal pain diarrhea dysuria swelling rashes or focal weakness. He does admit to having some numbness in his cheeks bilaterally which occurred initially but has since resolved. He denies any ataxia. Currently he has mild pressure in his head and mild blurred vision but declines pain medications when offered. He does continue to smoke cigarettes, does not partake in alcohol though he does have an alcohol past and had admitted to behavioral health for detox several years ago.  PMD:  Johnella Moloney  Patient is a 40 y.o. male presenting with shortness of breath. The history is provided by the patient, the spouse and medical records.  Shortness of Breath  Associated symptoms include shortness of breath.    Past Medical History  Diagnosis Date  . Hypertension     Past Surgical History  Procedure Date  . Back surgery     No family history on file.  History  Substance Use  Topics  . Smoking status: Current Some Day Smoker  . Smokeless tobacco: Not on file  . Alcohol Use: No      Review of Systems  Respiratory: Positive for shortness of breath.   All other systems reviewed and are negative.    Allergies  Review of patient's allergies indicates no known allergies.  Home Medications   Current Outpatient Rx  Name Route Sig Dispense Refill  . EMETROL 1.87-1.87-21.5 PO SOLN Oral Take 10 mLs by mouth as needed. For nausea     . GOODY HEADACHE PO Oral Take 1 packet by mouth as needed. For pain     . ESCITALOPRAM OXALATE 10 MG PO TABS Oral Take 10 mg by mouth daily.      Marland Kitchen ESOMEPRAZOLE MAGNESIUM 40 MG PO CPDR Oral Take 40 mg by mouth daily before breakfast.      . HYDROCODONE-ACETAMINOPHEN 10-325 MG PO TABS Oral Take 1 tablet by mouth every 6 (six) hours as needed. For pain     . METOPROLOL SUCCINATE ER 100 MG PO TB24 Oral Take 50 mg by mouth daily.      Marland Kitchen LISINOPRIL 10 MG PO TABS Oral Take 1 tablet (10 mg total) by mouth daily. 30 tablet 1    BP 158/103  Pulse 68  Temp(Src) 98.3 F (36.8 C) (Oral)  Resp 18  Ht 5\' 10"  (1.778 m)  Wt 215 lb (97.523 kg)  BMI 30.85 kg/m2  SpO2 98%  Physical Exam  Nursing note and vitals reviewed. Constitutional: He appears well-developed and well-nourished. No  distress.  HENT:  Head: Normocephalic and atraumatic.  Mouth/Throat: Oropharynx is clear and moist. No oropharyngeal exudate.  Eyes: Conjunctivae and EOM are normal. Pupils are equal, round, and reactive to light. Right eye exhibits no discharge. Left eye exhibits no discharge. No scleral icterus.  Neck: Normal range of motion. Neck supple. No JVD present. No thyromegaly present.  Cardiovascular: Normal rate, regular rhythm, normal heart sounds and intact distal pulses.  Exam reveals no gallop and no friction rub.   No murmur heard. Pulmonary/Chest: Effort normal and breath sounds normal. No respiratory distress. He has no wheezes. He has no rales.    Abdominal: Soft. Bowel sounds are normal. He exhibits no distension and no mass. There is no tenderness.  Musculoskeletal: Normal range of motion. He exhibits no edema and no tenderness.  Lymphadenopathy:    He has no cervical adenopathy.  Neurological: He is alert. Coordination normal.       Neurologic exam:  Speech clear, pupils equal round reactive to light, extraocular movements intact  Normal peripheral visual fields Cranial nerves III through XII normal including no facial droop Follows commands, moves all extremities x4, normal strength to bilateral upper and lower extremities at all major muscle groups including grip Sensation normal to light touch and pinprick Coordination intact, no limb ataxia, finger-nose-finger normal Rapid alternating movements normal No pronator drift Gait normal  Visual acuity normal to gross exam  Skin: Skin is warm and dry. No rash noted. No erythema.  Psychiatric: He has a normal mood and affect. His behavior is normal.    ED Course  Procedures (including critical care time)  Labs Reviewed  BASIC METABOLIC PANEL - Abnormal; Notable for the following:    Glucose, Bld 101 (*)    All other components within normal limits  CBC  DIFFERENTIAL  TROPONIN I  URINALYSIS, ROUTINE W REFLEX MICROSCOPIC   Ct Head Wo Contrast  09/18/2011  *RADIOLOGY REPORT*  Clinical Data: 40 year old male with headache, confusion, visual changes.  CT HEAD WITHOUT CONTRAST  Technique:  Contiguous axial images were obtained from the base of the skull through the vertex without contrast.  Comparison: 08/11/2009.  Findings: Occasional minor paranasal sinus mucosal thickening. Visualized orbits and scalp soft tissues are within normal limits. No acute osseous abnormality identified.  Cerebral volume is within normal limits for age.  No ventriculomegaly.  Stable probable dilated perivascular space at the inferior right basal ganglia. No evidence of cortically based acute  infarction identified.  No midline shift, mass effect, or evidence of mass lesion.  No acute intracranial hemorrhage identified.  No suspicious intracranial vascular hyperdensity.  IMPRESSION: Stable, negative noncontrast CT of the brain.  Original Report Authenticated By: Harley Hallmark, M.D.   Dg Chest Portable 1 View  09/18/2011  *RADIOLOGY REPORT*  Clinical Data: Axillary pain.  Shortness of breath.  Confusion. Syncope.  Diaphoresis.  PORTABLE CHEST - 1 VIEW  Comparison: 03/14/2010  Findings: Right midlung scarring appears stable. A 1.2 x 0.6 cm density projecting over the lingula probably represents superimposition of osseous and vascular shadows, but I cannot exclude a true pulmonary nodule.  Cardiac and mediastinal contours appear unremarkable.  No pleural effusion noted.  IMPRESSION:  1.  Increased focal density projecting over the lingula has a high probability of represent superimposition of vascular osseous shadows, but is increased in prominence compared the prior exam.  I cannot exclude a neoplastic pulmonary nodule.  CT the chest is recommended. 2.  Stable right mid lung scarring.  Original Report  Authenticated By: Dellia Cloud, M.D.     1. Hypertension       MDM  Has no acute findings on exam, his mental status is very normal and is alert and oriented to all questions that I asked. His blood pressure currently is 173/101.  There is no focal neurologic deficits and his cardiac and respiratory exam is normal without any edema in his lower extremities. EKG shows sinus bradycardia but no other acute findings. Proceed with CT scan, laboratory evaluation for other sources  ED ECG REPORT   Date: 09/18/2011   Rate: 59  Rhythm: normal sinus rhythm  QRS Axis: normal  Intervals: normal  ST/T Wave abnormalities: normal  Conduction Disutrbances:none  Narrative Interpretation:   Old EKG Reviewed: None available   Results reviewed showing normal metabolic panel, normal troponin,  normal blood counts.  CT scan reviewed by myself and radiology showing stable negative noncontrast CT scan of the brain.  Chest x-ray shows no acute findings, abnormal finding of focal density related the patient and encouraged to followup.    Blood pressure rechecked at 158/109. This is a significant improvement and the patient states that he has no focal symptoms at this time. I have discussed with him at length the need for followup for blood pressure check, he has requested cardiology followup which I have given him and will start him on lisinopril once daily. I have cautioned him against a side effects of angioedema.  Discharge prescription  Lisinopril  Vida Roller, MD 09/18/11 2148

## 2011-09-18 NOTE — ED Notes (Signed)
Dr .  Hyacinth Meeker in to recheck pt

## 2011-10-23 ENCOUNTER — Encounter (HOSPITAL_COMMUNITY): Payer: Self-pay | Admitting: *Deleted

## 2011-10-23 ENCOUNTER — Emergency Department (HOSPITAL_COMMUNITY): Payer: 59

## 2011-10-23 ENCOUNTER — Emergency Department (HOSPITAL_COMMUNITY)
Admission: EM | Admit: 2011-10-23 | Discharge: 2011-10-23 | Disposition: A | Discharge status: Home / Self Care | Payer: 59 | Attending: Emergency Medicine | Admitting: Emergency Medicine

## 2011-10-23 ENCOUNTER — Other Ambulatory Visit: Payer: Self-pay

## 2011-10-23 DIAGNOSIS — F411 Generalized anxiety disorder: Secondary | ICD-10-CM | POA: Insufficient documentation

## 2011-10-23 DIAGNOSIS — F419 Anxiety disorder, unspecified: Secondary | ICD-10-CM

## 2011-10-23 DIAGNOSIS — I10 Essential (primary) hypertension: Secondary | ICD-10-CM | POA: Insufficient documentation

## 2011-10-23 DIAGNOSIS — Z79899 Other long term (current) drug therapy: Secondary | ICD-10-CM | POA: Insufficient documentation

## 2011-10-23 DIAGNOSIS — R5381 Other malaise: Secondary | ICD-10-CM | POA: Insufficient documentation

## 2011-10-23 DIAGNOSIS — R109 Unspecified abdominal pain: Secondary | ICD-10-CM | POA: Insufficient documentation

## 2011-10-23 DIAGNOSIS — R51 Headache: Secondary | ICD-10-CM | POA: Insufficient documentation

## 2011-10-23 LAB — POCT I-STAT, CHEM 8
BUN: 8 mg/dL (ref 6–23)
Calcium, Ion: 1.15 mmol/L (ref 1.12–1.32)
Chloride: 104 mEq/L (ref 96–112)
Creatinine, Ser: 0.9 mg/dL (ref 0.50–1.35)
Glucose, Bld: 90 mg/dL (ref 70–99)
HCT: 40 % (ref 39.0–52.0)
Hemoglobin: 13.6 g/dL (ref 13.0–17.0)
Potassium: 4.1 mEq/L (ref 3.5–5.1)
Sodium: 140 mEq/L (ref 135–145)
TCO2: 28 mmol/L (ref 0–100)

## 2011-10-23 LAB — DIFFERENTIAL
Basophils Absolute: 0.1 10*3/uL (ref 0.0–0.1)
Basophils Relative: 1 % (ref 0–1)
Eosinophils Absolute: 0.4 10*3/uL (ref 0.0–0.7)
Eosinophils Relative: 5 % (ref 0–5)
Lymphocytes Relative: 37 % (ref 12–46)
Lymphs Abs: 3.2 10*3/uL (ref 0.7–4.0)
Monocytes Absolute: 0.7 10*3/uL (ref 0.1–1.0)
Monocytes Relative: 9 % (ref 3–12)
Neutro Abs: 4.2 10*3/uL (ref 1.7–7.7)
Neutrophils Relative %: 49 % (ref 43–77)

## 2011-10-23 LAB — URINALYSIS, ROUTINE W REFLEX MICROSCOPIC
Bilirubin Urine: NEGATIVE
Glucose, UA: NEGATIVE mg/dL
Hgb urine dipstick: NEGATIVE
Ketones, ur: NEGATIVE mg/dL
Leukocytes, UA: NEGATIVE
Nitrite: NEGATIVE
Protein, ur: NEGATIVE mg/dL
Specific Gravity, Urine: 1.017 (ref 1.005–1.030)
Urobilinogen, UA: 0.2 mg/dL (ref 0.0–1.0)
pH: 7 (ref 5.0–8.0)

## 2011-10-23 LAB — CBC
HCT: 38.7 % — ABNORMAL LOW (ref 39.0–52.0)
Hemoglobin: 13.6 g/dL (ref 13.0–17.0)
MCH: 32.2 pg (ref 26.0–34.0)
MCHC: 35.1 g/dL (ref 30.0–36.0)
MCV: 91.7 fL (ref 78.0–100.0)
Platelets: 228 10*3/uL (ref 150–400)
RBC: 4.22 MIL/uL (ref 4.22–5.81)
RDW: 12.1 % (ref 11.5–15.5)
WBC: 8.6 10*3/uL (ref 4.0–10.5)

## 2011-10-23 LAB — POCT I-STAT TROPONIN I: Troponin i, poc: 0 ng/mL (ref 0.00–0.08)

## 2011-10-23 MED ORDER — SODIUM CHLORIDE 0.9 % IV SOLN
INTRAVENOUS | Status: DC
  Administered 2011-10-23: 21:00:00 via INTRAVENOUS

## 2011-10-23 MED ORDER — LORAZEPAM 1 MG PO TABS: 1.0000 mg | ORAL_TABLET | Freq: Three times a day (TID) | ORAL | Status: AC | PRN

## 2011-10-23 MED ORDER — METOCLOPRAMIDE HCL 5 MG/ML IJ SOLN
10.0000 mg | Freq: Once | INTRAMUSCULAR | Status: AC
  Administered 2011-10-23: 10 mg via INTRAVENOUS
  Filled 2011-10-23: qty 2

## 2011-10-23 MED ORDER — IOHEXOL 300 MG/ML  SOLN
100.0000 mL | Freq: Once | INTRAMUSCULAR | Status: AC | PRN
  Administered 2011-10-23: 100 mL via INTRAVENOUS

## 2011-10-23 MED ORDER — DIPHENHYDRAMINE HCL 50 MG/ML IJ SOLN
25.0000 mg | Freq: Once | INTRAMUSCULAR | Status: AC
Start: 2011-10-23 — End: 2011-10-23
  Administered 2011-10-23: 25 mg via INTRAVENOUS
  Filled 2011-10-23: qty 1

## 2011-10-23 NOTE — ED Notes (Signed)
Pt is aware of the need for urine. 

## 2011-10-23 NOTE — ED Notes (Signed)
Patient transported to CT 

## 2011-10-23 NOTE — ED Notes (Signed)
MD bedside to speak with pt/family

## 2011-10-23 NOTE — ED Notes (Signed)
MD bedside

## 2011-10-23 NOTE — ED Notes (Signed)
Pt states not having pain, states head feels full, resp even unlabored, skin pwd, ambulates to room, steady gait noted

## 2011-10-23 NOTE — ED Notes (Signed)
Pt reports head pressure, nausea, no vomiting. Photosensitivity. Denies blurred vision. Reports increase in fatigue/weakness.

## 2011-10-23 NOTE — ED Notes (Signed)
Pt returns to room, mini lab notified of need for lab samples

## 2011-10-23 NOTE — ED Provider Notes (Signed)
History     CSN: [id number]  Arrival date & time 10/23/11  1804   First MD Initiated Contact with Patient 10/23/11 1943      Chief Complaint  Patient presents with  . Hypertension    (Consider location/radiation/quality/duration/timing/severity/associated sxs/prior treatment) Patient is a 41 y.o. male presenting with hypertension. The history is provided by the patient. No language interpreter was used.  Hypertension This is a new problem. The current episode started more than 1 week ago (1 month). The problem occurs constantly. The problem has been gradually worsening. Associated symptoms include headaches. Pertinent negatives include no chest pain, no abdominal pain and no shortness of breath. The symptoms are aggravated by nothing. The symptoms are relieved by nothing. Treatments tried: has been on numerous bp meds. The treatment provided mild relief.    Past Medical History  Diagnosis Date  . Hypertension     Past Surgical History  Procedure Date  . Back surgery   . Knee surgery     No family history on file.  History  Substance Use Topics  . Smoking status: Former Games developer  . Smokeless tobacco: Not on file  . Alcohol Use: No      Review of Systems  Constitutional: Positive for fatigue. Negative for fever, chills, activity change and appetite change.  HENT: Negative for congestion, sore throat, rhinorrhea, neck pain and neck stiffness.   Respiratory: Negative for cough and shortness of breath.   Cardiovascular: Negative for chest pain and palpitations.  Gastrointestinal: Negative for nausea, vomiting and abdominal pain.  Genitourinary: Negative for dysuria, urgency, frequency and flank pain.  Musculoskeletal: Negative for myalgias, back pain and arthralgias.  Neurological: Positive for headaches. Negative for dizziness, weakness, light-headedness and numbness.  All other systems reviewed and are negative.    Allergies  Review of patient's allergies indicates  no known allergies.  Home Medications   Current Outpatient Rx  Name Route Sig Dispense Refill  . EMETROL 1.87-1.87-21.5 PO SOLN Oral Take 10 mLs by mouth as needed. For nausea     . GOODY HEADACHE PO Oral Take 1 packet by mouth as needed. For pain     . VITAMIN D 1000 UNITS PO TABS Oral Take 1,000 Units by mouth daily.    Marland Kitchen ESCITALOPRAM OXALATE 10 MG PO TABS Oral Take 10 mg by mouth daily.      Marland Kitchen ESOMEPRAZOLE MAGNESIUM 40 MG PO CPDR Oral Take 40 mg by mouth daily before breakfast.      . METOPROLOL SUCCINATE ER 100 MG PO TB24 Oral Take 50 mg by mouth daily.      Marland Kitchen OLMESARTAN MEDOXOMIL 20 MG PO TABS Oral Take 20 mg by mouth daily.    Marland Kitchen LORAZEPAM 1 MG PO TABS Oral Take 1 tablet (1 mg total) by mouth 3 (three) times daily as needed for anxiety. 15 tablet 0    BP 163/112  Pulse 67  Resp 21  Ht 5\' 10"  (1.778 m)  Wt 219 lb (99.338 kg)  BMI 31.42 kg/m2  SpO2 96%  Physical Exam  Nursing note and vitals reviewed. Constitutional: He is oriented to person, place, and time. He appears well-developed and well-nourished. No distress.  HENT:  Head: Normocephalic and atraumatic.  Mouth/Throat: Oropharynx is clear and moist.  Eyes: Conjunctivae and EOM are normal. Pupils are equal, round, and reactive to light.  Neck: Normal range of motion. Neck supple.  Cardiovascular: Normal rate, regular rhythm, normal heart sounds and intact distal pulses.  Exam reveals no gallop  and no friction rub.   No murmur heard. Pulmonary/Chest: Effort normal and breath sounds normal. No respiratory distress.  Abdominal: Soft. Bowel sounds are normal. There is no tenderness.  Musculoskeletal: Normal range of motion. He exhibits no tenderness.  Neurological: He is alert and oriented to person, place, and time. He has normal strength. No cranial nerve deficit or sensory deficit.  Skin: Skin is warm and dry.    ED Course  Procedures (including critical care time)   Date: 10/23/2011  Rate: 68  Rhythm: normal  sinus rhythm  QRS Axis: normal  Intervals: PR prolonged  ST/T Wave abnormalities: normal  Conduction Disutrbances:first-degree A-V block   Narrative Interpretation:   Old EKG Reviewed: unchanged  Labs Reviewed  CBC - Abnormal; Notable for the following:    HCT 38.7 (*)    All other components within normal limits  URINALYSIS, ROUTINE W REFLEX MICROSCOPIC  DIFFERENTIAL  POCT I-STAT, CHEM 8  POCT I-STAT TROPONIN I   Dg Chest 2 View  10/23/2011  *RADIOLOGY REPORT*  Clinical Data: Hypertension.  Abdominal pain.  CHEST - 2 VIEW  Comparison: 09/18/2011 and 03/14/2010  Findings: Heart size and vascularity are normal and the lungs are clear except for slight linear scarring in the right midzone and at the left base, unchanged since 03/14/2010.  No effusions.  No significant osseous abnormality.  IMPRESSION: No acute abnormality.  Original Report Authenticated By: Gwynn Burly, M.D.   Ct Abdomen Pelvis W Contrast  10/23/2011  *RADIOLOGY REPORT*  Clinical Data: .  Uncontrolled hypertension.  Possible adrenal mass.  CT ABDOMEN AND PELVIS WITH CONTRAST  Technique:  Multidetector CT imaging of the abdomen and pelvis was performed following the standard protocol during bolus administration of intravenous contrast.  Contrast: OMNIPAQUE IOHEXOL 300 MG/ML IV SOLN  Comparison: CT 03/05/2009  Findings: There is atelectasis and/or scarring at both lung bases. No pleural or pericardial fluid.  The liver has a normal appearance without focal lesions or biliary ductal dilatation.  No calcified gallstones.  The spleen is normal.  The pancreas is normal.  Both adrenal glands are normal.  Both kidneys are normal.  No cyst, mass, stone or hydronephrosis.  The aorta and IVC are normal.  No retroperitoneal mass or adenopathy.  No free intraperitoneal fluid or air.  No bowel pathology.  Bladder, prostate gland and seminal vesicles appear normal.  There is been previous lower lumbar spinal fusion.  IMPRESSION:  Negative CT scan of the abdomen pelvis.  Specific to the clinical question, there is no adrenal mass.  Linear atelectasis or scarring at both lung bases, similar to June 2010, favoring scarring.  Original Report Authenticated By: Thomasenia Sales, M.D.     1. Hypertension   2. Anxiety       MDM  Hypertension. Patient has had numerous symptoms which he describes as fogging of his memory and pressure in the head. These are exacerbated when he becomes preoccupied with the symptoms. I feel he has a component of anxiety which is exacerbating his overall condition. He is on Lexapro for which she's been on for several years. I will add Ativan for him to take as needed however I instructed him to followup with his primary care physician as he will likely require adjustment of his psychiatric medication. He is not a threat to himself or others at this time is safe for discharge to home        Dayton Bailiff, MD 10/23/11 2216

## 2013-03-28 ENCOUNTER — Other Ambulatory Visit: Payer: Self-pay | Admitting: Internal Medicine

## 2013-03-28 DIAGNOSIS — M549 Dorsalgia, unspecified: Secondary | ICD-10-CM

## 2013-04-01 ENCOUNTER — Other Ambulatory Visit: Payer: 59

## 2013-04-02 ENCOUNTER — Ambulatory Visit
Admission: RE | Admit: 2013-04-02 | Discharge: 2013-04-02 | Disposition: A | Discharge status: Home / Self Care | Payer: 59 | Source: Ambulatory Visit | Attending: Internal Medicine | Admitting: Internal Medicine

## 2013-04-02 DIAGNOSIS — M549 Dorsalgia, unspecified: Secondary | ICD-10-CM

## 2014-06-22 ENCOUNTER — Other Ambulatory Visit: Payer: Self-pay | Admitting: Internal Medicine

## 2014-06-22 DIAGNOSIS — R51 Headache: Principal | ICD-10-CM

## 2014-06-22 DIAGNOSIS — R519 Headache, unspecified: Secondary | ICD-10-CM

## 2014-06-25 ENCOUNTER — Other Ambulatory Visit: Payer: Self-pay | Admitting: Internal Medicine

## 2014-06-26 ENCOUNTER — Ambulatory Visit
Admission: RE | Admit: 2014-06-26 | Discharge: 2014-06-26 | Disposition: A | Discharge status: Home / Self Care | Payer: 59 | Source: Ambulatory Visit | Attending: Internal Medicine | Admitting: Internal Medicine

## 2014-06-26 DIAGNOSIS — R519 Headache, unspecified: Secondary | ICD-10-CM

## 2014-06-26 DIAGNOSIS — R51 Headache: Principal | ICD-10-CM

## 2015-04-19 ENCOUNTER — Other Ambulatory Visit: Payer: Self-pay | Admitting: Physician Assistant

## 2015-04-19 NOTE — H&P (Signed)
Kenneth [name] comes in for follow up after his MRI.  He continues to complain about a broken hinged feeling in his knee with catching and popping that tends to be more anterior.  MRI shows some progressive chondromalacia, mostly on the medial patella facet and a little bit more on the posterolateral tibial plateau.  The most dramatic finding is a very prominent medial plica that is abrading the medial patella and medial femoral condyle.  Fortunately no progression of meniscus tears and what we had done back in 2011 continues to look good.  His degree of arthritis is not that extreme.  In light of that scan after I have reviewed the report and scan itself, I reexamined Kenneth [name].    EXAMINATION: His specific exam reveals exquisite tenderness over his plica right where you would expect based on his findings.  Full motion.  Stable ligaments otherwise.    DISPOSITION:  More than 25 minutes spent face-to-face with Kenneth [name].  Need for intervention straightforward.  This is getting worse rather than better.  Exam under anesthesia, arthroscopy, excise his plica and chondroplasty.  We will look at degenerative changes, but I am hoping not to see much more in the way of degenerative changes.  He understands, agrees and wants to proceed, as he is getting worse rather than better.  Paperwork complete.  All questions answered.    [name], M.D.

## 2015-04-26 ENCOUNTER — Other Ambulatory Visit: Payer: Self-pay | Admitting: Physician Assistant

## 2015-04-26 NOTE — H&P (Signed)
Kenneth [name] comes in for follow up after his MRI of his right knee.  He continues to complain about a broken hinged feeling in his knee with catching and popping that tends to be more anterior.  MRI shows some progressive chondromalacia, mostly on the medial patella facet and a little bit more on the posterolateral tibial plateau.  The most dramatic finding is a very prominent medial plica that is abrading the medial patella and medial femoral condyle.  Fortunately no progression of meniscus tears and what we had done back in 2011 continues to look good.  His degree of arthritis is not that extreme.  In light of that scan after I have reviewed the report and scan itself, I reexamined Kenneth [name].    EXAMINATION: His specific exam reveals exquisite tenderness over his plica right where you would expect based on his findings.  Full motion.  Stable ligaments otherwise.    DISPOSITION:  More than 25 minutes spent face-to-face with Kenneth [name].  Need for intervention straightforward.  This is getting worse rather than better.  Exam under anesthesia, arthroscopy, excise his plica and chondroplasty.  We will look at degenerative changes, but I am hoping not to see much more in the way of degenerative changes.  He understands, agrees and wants to proceed, as he is getting worse rather than better.  Paperwork complete.  All questions answered.    [name], M.D.

## 2015-04-27 ENCOUNTER — Encounter (HOSPITAL_BASED_OUTPATIENT_CLINIC_OR_DEPARTMENT_OTHER): Payer: Self-pay | Admitting: *Deleted

## 2015-04-28 ENCOUNTER — Encounter (HOSPITAL_BASED_OUTPATIENT_CLINIC_OR_DEPARTMENT_OTHER)
Admission: RE | Admit: 2015-04-28 | Discharge: 2015-04-28 | Disposition: A | Discharge status: Home / Self Care | Payer: Commercial Managed Care - HMO | Source: Ambulatory Visit | Attending: Orthopedic Surgery | Admitting: Orthopedic Surgery

## 2015-04-28 DIAGNOSIS — F419 Anxiety disorder, unspecified: Secondary | ICD-10-CM | POA: Diagnosis not present

## 2015-04-28 DIAGNOSIS — M1711 Unilateral primary osteoarthritis, right knee: Secondary | ICD-10-CM | POA: Diagnosis not present

## 2015-04-28 DIAGNOSIS — M6751 Plica syndrome, right knee: Secondary | ICD-10-CM | POA: Diagnosis not present

## 2015-04-28 DIAGNOSIS — K219 Gastro-esophageal reflux disease without esophagitis: Secondary | ICD-10-CM | POA: Diagnosis not present

## 2015-04-28 DIAGNOSIS — Z87891 Personal history of nicotine dependence: Secondary | ICD-10-CM | POA: Diagnosis not present

## 2015-04-28 DIAGNOSIS — M94261 Chondromalacia, right knee: Secondary | ICD-10-CM | POA: Diagnosis present

## 2015-04-28 DIAGNOSIS — I10 Essential (primary) hypertension: Secondary | ICD-10-CM | POA: Diagnosis not present

## 2015-04-28 LAB — BASIC METABOLIC PANEL
Anion gap: 8 (ref 5–15)
BUN: 17 mg/dL (ref 6–20)
CO2: 28 mmol/L (ref 22–32)
Calcium: 9.5 mg/dL (ref 8.9–10.3)
Chloride: 101 mmol/L (ref 101–111)
Creatinine, Ser: 0.9 mg/dL (ref 0.61–1.24)
GFR calc Af Amer: >60 mL/min (ref >60)
GFR calc non Af Amer: >60 mL/min (ref >60)
Glucose, Bld: 103 mg/dL — ABNORMAL HIGH (ref 65–99)
Potassium: 4.4 mmol/L (ref 3.5–5.1)
Sodium: 137 mmol/L (ref 135–145)

## 2015-04-29 ENCOUNTER — Encounter (HOSPITAL_BASED_OUTPATIENT_CLINIC_OR_DEPARTMENT_OTHER)
Admission: RE | Disposition: A | Discharge status: Home / Self Care | Payer: Self-pay | Source: Ambulatory Visit | Attending: Orthopedic Surgery

## 2015-04-29 ENCOUNTER — Ambulatory Visit (HOSPITAL_BASED_OUTPATIENT_CLINIC_OR_DEPARTMENT_OTHER)
Admission: RE | Admit: 2015-04-29 | Discharge: 2015-04-29 | Disposition: A | Discharge status: Home / Self Care | Payer: Commercial Managed Care - HMO | Source: Ambulatory Visit | Attending: Orthopedic Surgery | Admitting: Orthopedic Surgery

## 2015-04-29 ENCOUNTER — Ambulatory Visit (HOSPITAL_BASED_OUTPATIENT_CLINIC_OR_DEPARTMENT_OTHER): Payer: Commercial Managed Care - HMO | Admitting: Certified Registered"

## 2015-04-29 ENCOUNTER — Encounter (HOSPITAL_BASED_OUTPATIENT_CLINIC_OR_DEPARTMENT_OTHER): Payer: Self-pay | Admitting: Certified Registered"

## 2015-04-29 DIAGNOSIS — I10 Essential (primary) hypertension: Secondary | ICD-10-CM | POA: Insufficient documentation

## 2015-04-29 DIAGNOSIS — K219 Gastro-esophageal reflux disease without esophagitis: Secondary | ICD-10-CM | POA: Insufficient documentation

## 2015-04-29 DIAGNOSIS — Z87891 Personal history of nicotine dependence: Secondary | ICD-10-CM | POA: Insufficient documentation

## 2015-04-29 DIAGNOSIS — F419 Anxiety disorder, unspecified: Secondary | ICD-10-CM | POA: Insufficient documentation

## 2015-04-29 DIAGNOSIS — M94261 Chondromalacia, right knee: Secondary | ICD-10-CM | POA: Insufficient documentation

## 2015-04-29 DIAGNOSIS — M1711 Unilateral primary osteoarthritis, right knee: Secondary | ICD-10-CM | POA: Insufficient documentation

## 2015-04-29 DIAGNOSIS — M6751 Plica syndrome, right knee: Secondary | ICD-10-CM | POA: Insufficient documentation

## 2015-04-29 HISTORY — PX: KNEE ARTHROSCOPY WITH EXCISION PLICA: SHX5647

## 2015-04-29 HISTORY — PX: CHONDROPLASTY: SHX5177

## 2015-04-29 HISTORY — DX: Anxiety disorder, unspecified: F41.9

## 2015-04-29 HISTORY — PX: KNEE ARTHROSCOPY WITH MEDIAL MENISECTOMY: SHX5651

## 2015-04-29 HISTORY — DX: Gastro-esophageal reflux disease without esophagitis: K21.9

## 2015-04-29 HISTORY — PX: KNEE ARTHROSCOPY WITH LATERAL MENISECTOMY: SHX6193

## 2015-04-29 SURGERY — ARTHROSCOPY, KNEE, WITH PLICA EXCISION
Anesthesia: General | Site: Knee | Laterality: Right

## 2015-04-29 MED ORDER — CHLORHEXIDINE GLUCONATE 4 % EX LIQD: 60.0000 mL | Freq: Once | CUTANEOUS | Status: DC

## 2015-04-29 MED ORDER — LACTATED RINGERS IV SOLN
INTRAVENOUS | Status: DC
  Administered 2015-04-29 (×2): via INTRAVENOUS

## 2015-04-29 MED ORDER — OXYCODONE-ACETAMINOPHEN 5-325 MG PO TABS: 1.0000 | ORAL_TABLET | ORAL | Status: DC | PRN

## 2015-04-29 MED ORDER — FENTANYL CITRATE (PF) 100 MCG/2ML IJ SOLN
50.0000 ug | INTRAMUSCULAR | Status: DC | PRN
  Administered 2015-04-29: 100 ug via INTRAVENOUS

## 2015-04-29 MED ORDER — BUPIVACAINE HCL (PF) 0.5 % IJ SOLN
INTRAMUSCULAR | Status: DC | PRN
  Administered 2015-04-29: 20 mL via INTRA_ARTICULAR

## 2015-04-29 MED ORDER — FENTANYL CITRATE (PF) 100 MCG/2ML IJ SOLN
INTRAMUSCULAR | Status: AC
  Filled 2015-04-29: qty 6

## 2015-04-29 MED ORDER — HYDROMORPHONE HCL 1 MG/ML IJ SOLN
0.2500 mg | INTRAMUSCULAR | Status: DC | PRN
  Administered 2015-04-29 (×3): 0.5 mg via INTRAVENOUS

## 2015-04-29 MED ORDER — MEPERIDINE HCL 25 MG/ML IJ SOLN: 6.2500 mg | INTRAMUSCULAR | Status: DC | PRN

## 2015-04-29 MED ORDER — GLYCOPYRROLATE 0.2 MG/ML IJ SOLN: 0.2000 mg | Freq: Once | INTRAMUSCULAR | Status: DC | PRN

## 2015-04-29 MED ORDER — PROPOFOL 10 MG/ML IV BOLUS
INTRAVENOUS | Status: DC | PRN
  Administered 2015-04-29: 200 mg via INTRAVENOUS

## 2015-04-29 MED ORDER — ONDANSETRON HCL 4 MG/2ML IJ SOLN
INTRAMUSCULAR | Status: DC | PRN
  Administered 2015-04-29: 4 mg via INTRAVENOUS

## 2015-04-29 MED ORDER — CEFAZOLIN SODIUM-DEXTROSE 2-3 GM-% IV SOLR
INTRAVENOUS | Status: AC
  Filled 2015-04-29: qty 50

## 2015-04-29 MED ORDER — CEFAZOLIN SODIUM-DEXTROSE 2-3 GM-% IV SOLR
2.0000 g | INTRAVENOUS | Status: AC
  Administered 2015-04-29: 2 g via INTRAVENOUS

## 2015-04-29 MED ORDER — LIDOCAINE HCL (CARDIAC) 20 MG/ML IV SOLN
INTRAVENOUS | Status: DC | PRN
  Administered 2015-04-29: 60 mg via INTRAVENOUS

## 2015-04-29 MED ORDER — SODIUM CHLORIDE 0.9 % IR SOLN
Status: DC | PRN
  Administered 2015-04-29: 4500 mL

## 2015-04-29 MED ORDER — MIDAZOLAM HCL 2 MG/2ML IJ SOLN
INTRAMUSCULAR | Status: AC
  Filled 2015-04-29: qty 2

## 2015-04-29 MED ORDER — LACTATED RINGERS IV SOLN: INTRAVENOUS | Status: DC

## 2015-04-29 MED ORDER — MIDAZOLAM HCL 2 MG/2ML IJ SOLN
1.0000 mg | INTRAMUSCULAR | Status: DC | PRN
  Administered 2015-04-29: 2 mg via INTRAVENOUS

## 2015-04-29 MED ORDER — HYDROMORPHONE HCL 1 MG/ML IJ SOLN
INTRAMUSCULAR | Status: AC
  Filled 2015-04-29: qty 1

## 2015-04-29 MED ORDER — METHYLPREDNISOLONE ACETATE 80 MG/ML IJ SUSP
INTRAMUSCULAR | Status: DC | PRN
  Administered 2015-04-29: 80 mg via INTRA_ARTICULAR

## 2015-04-29 MED ORDER — SCOPOLAMINE 1 MG/3DAYS TD PT72: 1.0000 | MEDICATED_PATCH | Freq: Once | TRANSDERMAL | Status: DC | PRN

## 2015-04-29 MED ORDER — EPHEDRINE SULFATE 50 MG/ML IJ SOLN
INTRAMUSCULAR | Status: DC | PRN
  Administered 2015-04-29: 10 mg via INTRAVENOUS

## 2015-04-29 MED ORDER — ONDANSETRON HCL 4 MG/2ML IJ SOLN: 4.0000 mg | Freq: Once | INTRAMUSCULAR | Status: DC | PRN

## 2015-04-29 MED ORDER — BUPIVACAINE HCL (PF) 0.5 % IJ SOLN
INTRAMUSCULAR | Status: AC
  Filled 2015-04-29: qty 30

## 2015-04-29 MED ORDER — HYDROMORPHONE HCL 1 MG/ML IJ SOLN
INTRAMUSCULAR | Status: AC
Start: 2015-04-29 — End: 2015-04-29
  Filled 2015-04-29: qty 1

## 2015-04-29 MED ORDER — DEXAMETHASONE SODIUM PHOSPHATE 4 MG/ML IJ SOLN
INTRAMUSCULAR | Status: DC | PRN
  Administered 2015-04-29: 10 mg via INTRAVENOUS

## 2015-04-29 MED ORDER — METHYLPREDNISOLONE ACETATE 80 MG/ML IJ SUSP
INTRAMUSCULAR | Status: AC
  Filled 2015-04-29: qty 1

## 2015-04-29 SURGICAL SUPPLY — 39 items
BANDAGE ELASTIC 6 VELCRO ST LF (GAUZE/BANDAGES/DRESSINGS) ×2 IMPLANT
BLADE CUDA 5.5 (BLADE) IMPLANT
BLADE CUDA GRT WHITE 3.5 (BLADE) IMPLANT
BLADE CUTTER GATOR 3.5 (BLADE) ×2 IMPLANT
BLADE CUTTER MENIS 5.5 (BLADE) IMPLANT
BLADE GREAT WHITE 4.2 (BLADE) ×2 IMPLANT
BUR OVAL 4.0 (BURR) IMPLANT
CUTTER MENISCUS  4.2MM (BLADE)
CUTTER MENISCUS 4.2MM (BLADE) IMPLANT
DRAPE ARTHROSCOPY W/POUCH 90 (DRAPES) ×2 IMPLANT
DURAPREP 26ML APPLICATOR (WOUND CARE) ×2 IMPLANT
ELECT MENISCUS 165MM 90D (ELECTRODE) IMPLANT
ELECT REM PT RETURN 9FT ADLT (ELECTROSURGICAL) ×2
ELECTRODE REM PT RTRN 9FT ADLT (ELECTROSURGICAL) IMPLANT
GAUZE SPONGE 4X4 12PLY STRL (GAUZE/BANDAGES/DRESSINGS) ×2 IMPLANT
GAUZE XEROFORM 1X8 LF (GAUZE/BANDAGES/DRESSINGS) ×2 IMPLANT
GLOVE BIOGEL PI IND STRL 7.0 (GLOVE) ×1 IMPLANT
GLOVE BIOGEL PI INDICATOR 7.0 (GLOVE) ×3
GLOVE ECLIPSE 6.5 STRL STRAW (GLOVE) ×1 IMPLANT
GLOVE ECLIPSE 7.0 STRL STRAW (GLOVE) ×2 IMPLANT
GLOVE ORTHO TXT STRL SZ7.5 (GLOVE) ×1 IMPLANT
GLOVE SURG ORTHO 8.0 STRL STRW (GLOVE) ×2 IMPLANT
GOWN STRL REUS W/ TWL LRG LVL3 (GOWN DISPOSABLE) ×2 IMPLANT
GOWN STRL REUS W/ TWL XL LVL3 (GOWN DISPOSABLE) ×1 IMPLANT
GOWN STRL REUS W/TWL LRG LVL3 (GOWN DISPOSABLE) ×4
GOWN STRL REUS W/TWL XL LVL3 (GOWN DISPOSABLE) ×3 IMPLANT
HOLDER KNEE FOAM BLUE (MISCELLANEOUS) ×2 IMPLANT
IV NS IRRIG 3000ML ARTHROMATIC (IV SOLUTION) ×4 IMPLANT
KNEE WRAP E Z 3 GEL PACK (MISCELLANEOUS) ×2 IMPLANT
MANIFOLD NEPTUNE II (INSTRUMENTS) ×2 IMPLANT
PACK ARTHROSCOPY DSU (CUSTOM PROCEDURE TRAY) ×2 IMPLANT
PACK BASIN DAY SURGERY FS (CUSTOM PROCEDURE TRAY) ×2 IMPLANT
PENCIL BUTTON HOLSTER BLD 10FT (ELECTRODE) IMPLANT
SET ARTHROSCOPY TUBING (MISCELLANEOUS) ×2
SET ARTHROSCOPY TUBING LN (MISCELLANEOUS) ×1 IMPLANT
SUT ETHILON 3 0 PS 1 (SUTURE) ×2 IMPLANT
SUT VIC AB 3-0 FS2 27 (SUTURE) IMPLANT
TOWEL OR 17X24 6PK STRL BLUE (TOWEL DISPOSABLE) ×2 IMPLANT
WATER STERILE IRR 1000ML POUR (IV SOLUTION) ×2 IMPLANT

## 2015-04-29 NOTE — Transfer of Care (Signed)
Immediate Anesthesia Transfer of Care Note  Patient: Kenneth [name]  Procedure(s) Performed: Procedure(s): RIGHT KNEE ARTHROSCOPY, EXCISION OF PLICA, CHONDROPLASTY, PARTIAL MEDIAL MENISECTOMY AND PARTIAL LATERAL MENISECTOMY (Right) CHONDROPLASTY (Right) KNEE ARTHROSCOPY WITH MEDIAL MENISECTOMY (Right) KNEE ARTHROSCOPY WITH LATERAL MENISECTOMY (Right)  Patient Location: PACU  Anesthesia Type:General  Level of Consciousness: awake and patient cooperative  Airway & Oxygen Therapy: Patient Spontanous Breathing and Patient connected to face mask oxygen  Post-op Assessment: Report given to RN and Post -op Vital signs reviewed and stable  Post vital signs: Reviewed and stable  Last Vitals:  Filed Vitals:   04/29/15 0747  BP: 132/89  Pulse: 73  Temp: 36.9 C  Resp: 20    Complications: No apparent anesthesia complications

## 2015-04-29 NOTE — Anesthesia Preprocedure Evaluation (Addendum)
Anesthesia Evaluation  Patient identified by MRN, date of birth, ID band Patient awake    Reviewed: Allergy & Precautions, NPO status , Patient's Chart, lab work & pertinent test results  Airway Mallampati: I  TM Distance: >3 FB Neck ROM: Full    Dental   Pulmonary former smoker,    Pulmonary exam normal       Cardiovascular hypertension, Pt. on home beta blockers and Pt. on medications Normal cardiovascular exam    Neuro/Psych PSYCHIATRIC DISORDERS Anxiety    GI/Hepatic GERD-  Medicated and Controlled,  Endo/Other    Renal/GU      Musculoskeletal   Abdominal   Peds  Hematology   Anesthesia Other Findings   Reproductive/Obstetrics                           Anesthesia Physical Anesthesia Plan  ASA: II  Anesthesia Plan: General   Post-op Pain Management:    Induction: Intravenous  Airway Management Planned: LMA  Additional Equipment:   Intra-op Plan:   Post-operative Plan: Extubation in OR  Informed Consent: I have reviewed the patients History and Physical, chart, labs and discussed the procedure including the risks, benefits and alternatives for the proposed anesthesia with the patient or authorized representative who has indicated his/her understanding and acceptance.     Plan Discussed with: CRNA and Surgeon  Anesthesia Plan Comments:         Anesthesia Quick Evaluation

## 2015-04-29 NOTE — Discharge Instructions (Signed)

## 2015-04-29 NOTE — Anesthesia Procedure Notes (Signed)
Procedure Name: LMA Insertion Date/Time: 04/29/2015 8:42 AM Performed by: BLOCKER, TIMOTHY D Pre-anesthesia Checklist: Patient identified, Emergency Drugs available, Suction available and Patient being monitored Patient Re-evaluated:Patient Re-evaluated prior to inductionOxygen Delivery Method: Circle System Utilized Preoxygenation: Pre-oxygenation with 100% oxygen Intubation Type: IV induction Ventilation: Mask ventilation without difficulty LMA: LMA inserted LMA Size: 5.0 Number of attempts: 1 Airway Equipment and Method: Bite block Placement Confirmation: positive ETCO2 Tube secured with: Tape Dental Injury: Teeth and Oropharynx as per pre-operative assessment

## 2015-04-29 NOTE — Anesthesia Postprocedure Evaluation (Signed)
Anesthesia Post Note  Patient: Kenneth [name]  Procedure(s) Performed: Procedure(s) (LRB): RIGHT KNEE ARTHROSCOPY, EXCISION OF PLICA, CHONDROPLASTY, PARTIAL MEDIAL MENISECTOMY AND PARTIAL LATERAL MENISECTOMY (Right) CHONDROPLASTY (Right) KNEE ARTHROSCOPY WITH MEDIAL MENISECTOMY (Right) KNEE ARTHROSCOPY WITH LATERAL MENISECTOMY (Right)  Anesthesia type: general  Patient location: PACU  Post pain: Pain level controlled  Post assessment: Patient's Cardiovascular Status Stable  Last Vitals:  Filed Vitals:   04/29/15 1045  BP: 124/80  Pulse: 64  Temp: 36.5 C  Resp: 16    Post vital signs: Reviewed and stable  Level of consciousness: sedated  Complications: No apparent anesthesia complications

## 2015-04-29 NOTE — Interval H&P Note (Signed)
History and Physical Interval Note:  04/29/2015 7:28 AM  Kenneth [name]  has presented today for surgery, with the diagnosis of CHONDROMALACIA PATELLAE RIGHT KNEE,  PLICA SYNDROME RIGHT KNEE  The various methods of treatment have been discussed with the patient and family. After consideration of risks, benefits and other options for treatment, the patient has consented to  Procedure(s): RIGHT KNEE SCOPE PLICA, CHONDROPLASTY (Right) as a surgical intervention .  The patient's history has been reviewed, patient examined, no change in status, stable for surgery.  I have reviewed the patient's chart and labs.  Questions were answered to the patient's satisfaction.     [name]

## 2015-04-29 NOTE — H&P (View-Only) (Signed)
Kenneth [name] comes in for follow up after his MRI.  He continues to complain about a broken hinged feeling in his knee with catching and popping that tends to be more anterior.  MRI shows some progressive chondromalacia, mostly on the medial patella facet and a little bit more on the posterolateral tibial plateau.  The most dramatic finding is a very prominent medial plica that is abrading the medial patella and medial femoral condyle.  Fortunately no progression of meniscus tears and what we had done back in 2011 continues to look good.  His degree of arthritis is not that extreme.  In light of that scan after I have reviewed the report and scan itself, I reexamined Kenneth [name].    EXAMINATION: His specific exam reveals exquisite tenderness over his plica right where you would expect based on his findings.  Full motion.  Stable ligaments otherwise.    DISPOSITION:  More than 25 minutes spent face-to-face with Kenneth [name].  Need for intervention straightforward.  This is getting worse rather than better.  Exam under anesthesia, arthroscopy, excise his plica and chondroplasty.  We will look at degenerative changes, but I am hoping not to see much more in the way of degenerative changes.  He understands, agrees and wants to proceed, as he is getting worse rather than better.  Paperwork complete.  All questions answered.    Daniel F. Murphy, M.D.  

## 2015-04-30 ENCOUNTER — Encounter (HOSPITAL_BASED_OUTPATIENT_CLINIC_OR_DEPARTMENT_OTHER): Payer: Self-pay | Admitting: Orthopedic Surgery

## 2015-04-30 NOTE — Op Note (Signed)
NAMETERMAINE, ROUPP NO.:  [id number]  MEDICAL RECORD NO.:  [id number]  LOCATION:                               FACILITY:  [facility]  PHYSICIAN:  [name], M.D. DATE OF BIRTH:  02-16-1971  DATE OF PROCEDURE:  04/29/2015 DATE OF DISCHARGE:  04/29/2015                              OPERATIVE REPORT   PREOPERATIVE DIAGNOSIS:  Right knee progressive chondromalacia of patella with a large symptomatic fibrotic medial plica.  POSTOPERATIVE DIAGNOSES:  Right knee progressive chondromalacia of patella with a large symptomatic fibrotic medial plica with some grade 2 to mild grade 3 changes in the medial compartment and patella.  Also small radial tears that were left on the posterior horn of the medial meniscus.  Relatively large displaced flap tear off the front of the lateral meniscus and small off the back of the posterior horn.  PROCEDURES:  Right knee exam under anesthesia, arthroscopy.  Debridement of lateral meniscus.  Chondroplasty throughout.  Excised medial plica.  SURGEON:  [name], M.D.  ASSISTANT:  Mikey Kirschner, P.A.  ANESTHESIA:  General.  BLOOD LOSS:  Minimal.  SPECIMENS:  None.  CULTURES:  None.  COMPLICATIONS:  None.  DRESSINGS:  Soft compressive.  TOURNIQUET:  Not employed.  DESCRIPTION OF PROCEDURE:  The patient was brought to the operating room and placed on the operating table in supine position.  After adequate anesthesia had been obtained, leg holder applied, prepped and draped in usual sterile fashion.  Two portals, one each medial and lateral parapatellar.  Arthroscope was introduced.  Knee was distended and inspected.  Good tracking.  Grade 2 changes on the patella with some focal grade 3 changes medial.  Grade 2 to mild grade 3 on the trochlea. Large fibrotic medial plica.  Plica excised.  Chondroplasty to a stable surface.  There was a large displaced flap tear off the front of the lateral meniscus up into the  front of the notch blocking, full extension.  That was removed, retained some of the anterior third, contoured this in.  A small flap off the back of lateral meniscus was debrided.  The compartment otherwise looked good.  Medially, there had been a partial medial meniscectomy.  A little fraying of hole was left in the posterior horn, debrided to a stable surface.  Grade 2 to mild grade 3 changes on the condyle debrided.  Entire knee examined.  No other findings were appreciated.  Instruments and fluids were removed.  Portals were closed with nylon.  Knee injected with Depo-Medrol and Marcaine.  Sterile compressive dressing applied.  Anesthesia reversed.  Brought to the recovery room.  Tolerated the surgery well.  No complications.     [name], M.D.     DFM/MEDQ  D:  04/29/2015  T:  04/30/2015  Job:  [id number]

## 2015-06-04 ENCOUNTER — Other Ambulatory Visit: Payer: Self-pay | Admitting: Neurosurgery

## 2015-06-04 DIAGNOSIS — M5416 Radiculopathy, lumbar region: Secondary | ICD-10-CM

## 2015-06-08 ENCOUNTER — Ambulatory Visit
Admission: RE | Admit: 2015-06-08 | Discharge: 2015-06-08 | Disposition: A | Discharge status: Home / Self Care | Payer: Commercial Managed Care - HMO | Source: Ambulatory Visit | Attending: Neurosurgery | Admitting: Neurosurgery

## 2015-06-08 DIAGNOSIS — M5416 Radiculopathy, lumbar region: Secondary | ICD-10-CM

## 2015-06-08 MED ORDER — IOHEXOL 180 MG/ML  SOLN
15.0000 mL | Freq: Once | INTRAMUSCULAR | Status: DC | PRN
  Administered 2015-06-08: 15 mL via INTRATHECAL

## 2015-06-08 MED ORDER — DIAZEPAM 5 MG PO TABS
10.0000 mg | ORAL_TABLET | Freq: Once | ORAL | Status: AC
  Administered 2015-06-08: 10 mg via ORAL

## 2015-06-08 MED ORDER — ONDANSETRON HCL 4 MG/2ML IJ SOLN: 4.0000 mg | Freq: Four times a day (QID) | INTRAMUSCULAR | Status: DC | PRN

## 2015-06-08 NOTE — Progress Notes (Signed)
Pt states he has been off Lexapro for the past 2 days.  Discharge instructions explained to pt. 

## 2015-06-08 NOTE — Discharge Instructions (Signed)
Myelogram Discharge Instructions  1. Go home and rest quietly for the next 24 hours.  It is important to lie flat for the next 24 hours.  Get up only to go to the restroom.  You may lie in the bed or on a couch on your back, your stomach, your left side or your right side.  You may have one pillow under your head.  You may have pillows between your knees while you are on your side or under your knees while you are on your back.  2. DO NOT drive today.  Recline the seat as far back as it will go, while still wearing your seat belt, on the way home.  3. You may get up to go to the bathroom as needed.  You may sit up for 10 minutes to eat.  You may resume your normal diet and medications unless otherwise indicated.  Drink lots of extra fluids today and tomorrow.  4. The incidence of headache, nausea, or vomiting is about 5% (one in 20 patients).  If you develop a headache, lie flat and drink plenty of fluids until the headache goes away.  Caffeinated beverages may be helpful.  If you develop severe nausea and vomiting or a headache that does not go away with flat bed rest, call [phone].  5. You may resume normal activities after your 24 hours of bed rest is over; however, do not exert yourself strongly or do any heavy lifting tomorrow. If when you get up you have a headache when standing, go back to bed and force fluids for another 24 hours.  6. Call your physician for a follow-up appointment.  The results of your myelogram will be sent directly to your physician by the following day.  7. If you have any questions or if complications develop after you arrive home, please call [phone].  Discharge instructions have been explained to the patient.  The patient, or the person responsible for the patient, fully understands these instructions.      May resume Lexapro on Sept. 21, 2016, after 1:00 pm.

## 2016-06-10 ENCOUNTER — Encounter (HOSPITAL_COMMUNITY): Payer: Self-pay | Admitting: Emergency Medicine

## 2016-06-10 ENCOUNTER — Emergency Department (HOSPITAL_COMMUNITY): Payer: Commercial Managed Care - HMO

## 2016-06-10 ENCOUNTER — Emergency Department (HOSPITAL_COMMUNITY)
Admission: EM | Admit: 2016-06-10 | Discharge: 2016-06-10 | Disposition: A | Discharge status: Home / Self Care | Payer: Commercial Managed Care - HMO | Attending: Emergency Medicine | Admitting: Emergency Medicine

## 2016-06-10 DIAGNOSIS — Z79899 Other long term (current) drug therapy: Secondary | ICD-10-CM | POA: Insufficient documentation

## 2016-06-10 DIAGNOSIS — Z87891 Personal history of nicotine dependence: Secondary | ICD-10-CM | POA: Diagnosis not present

## 2016-06-10 DIAGNOSIS — I10 Essential (primary) hypertension: Secondary | ICD-10-CM | POA: Insufficient documentation

## 2016-06-10 DIAGNOSIS — R1031 Right lower quadrant pain: Secondary | ICD-10-CM | POA: Diagnosis present

## 2016-06-10 DIAGNOSIS — K5732 Diverticulitis of large intestine without perforation or abscess without bleeding: Secondary | ICD-10-CM

## 2016-06-10 HISTORY — DX: Diverticulitis of intestine, part unspecified, without perforation or abscess without bleeding: K57.92

## 2016-06-10 LAB — COMPREHENSIVE METABOLIC PANEL
ALT: 17 U/L (ref 17–63)
AST: 14 U/L — ABNORMAL LOW (ref 15–41)
Albumin: 4.4 g/dL (ref 3.5–5.0)
Alkaline Phosphatase: 57 U/L (ref 38–126)
Anion gap: 7 (ref 5–15)
BUN: 8 mg/dL (ref 6–20)
CO2: 28 mmol/L (ref 22–32)
Calcium: 9.2 mg/dL (ref 8.9–10.3)
Chloride: 102 mmol/L (ref 101–111)
Creatinine, Ser: 1.01 mg/dL (ref 0.61–1.24)
GFR calc Af Amer: >60 mL/min (ref >60)
GFR calc non Af Amer: >60 mL/min (ref >60)
Glucose, Bld: 111 mg/dL — ABNORMAL HIGH (ref 65–99)
Potassium: 4.1 mmol/L (ref 3.5–5.1)
Sodium: 137 mmol/L (ref 135–145)
Total Bilirubin: 1 mg/dL (ref 0.3–1.2)
Total Protein: 7.4 g/dL (ref 6.5–8.1)

## 2016-06-10 LAB — URINALYSIS, ROUTINE W REFLEX MICROSCOPIC
Bilirubin Urine: NEGATIVE
Glucose, UA: NEGATIVE mg/dL
Hgb urine dipstick: NEGATIVE
Ketones, ur: NEGATIVE mg/dL
Leukocytes, UA: NEGATIVE
Nitrite: NEGATIVE
Protein, ur: NEGATIVE mg/dL
Specific Gravity, Urine: <1.005 — ABNORMAL LOW (ref 1.005–1.030)
pH: 6.5 (ref 5.0–8.0)

## 2016-06-10 LAB — CBC
HCT: 41.8 % (ref 39.0–52.0)
Hemoglobin: 14.5 g/dL (ref 13.0–17.0)
MCH: 33.4 pg (ref 26.0–34.0)
MCHC: 34.7 g/dL (ref 30.0–36.0)
MCV: 96.3 fL (ref 78.0–100.0)
Platelets: 248 10*3/uL (ref 150–400)
RBC: 4.34 MIL/uL (ref 4.22–5.81)
RDW: 12.3 % (ref 11.5–15.5)
WBC: 9.9 10*3/uL (ref 4.0–10.5)

## 2016-06-10 LAB — LIPASE, BLOOD: Lipase: 21 U/L (ref 11–51)

## 2016-06-10 MED ORDER — CIPROFLOXACIN HCL 500 MG PO TABS: 500.0000 mg | ORAL_TABLET | Freq: Two times a day (BID) | ORAL | 0 refills | Status: DC

## 2016-06-10 MED ORDER — SODIUM CHLORIDE 0.9 % IV BOLUS (SEPSIS)
1000.0000 mL | Freq: Once | INTRAVENOUS | Status: AC
  Administered 2016-06-10: 1000 mL via INTRAVENOUS

## 2016-06-10 MED ORDER — MORPHINE SULFATE (PF) 4 MG/ML IV SOLN
4.0000 mg | Freq: Once | INTRAVENOUS | Status: AC
  Administered 2016-06-10: 4 mg via INTRAVENOUS
  Filled 2016-06-10: qty 1

## 2016-06-10 MED ORDER — IOPAMIDOL (ISOVUE-300) INJECTION 61%
100.0000 mL | Freq: Once | INTRAVENOUS | Status: AC | PRN
  Administered 2016-06-10: 100 mL via INTRAVENOUS

## 2016-06-10 MED ORDER — METRONIDAZOLE IN NACL 5-0.79 MG/ML-% IV SOLN
500.0000 mg | Freq: Once | INTRAVENOUS | Status: AC
  Administered 2016-06-10: 500 mg via INTRAVENOUS
  Filled 2016-06-10: qty 100

## 2016-06-10 MED ORDER — METRONIDAZOLE 500 MG PO TABS: 500.0000 mg | ORAL_TABLET | Freq: Four times a day (QID) | ORAL | 0 refills | Status: DC

## 2016-06-10 MED ORDER — ONDANSETRON HCL 4 MG PO TABS: 4.0000 mg | ORAL_TABLET | Freq: Four times a day (QID) | ORAL | 0 refills | Status: DC

## 2016-06-10 MED ORDER — DIATRIZOATE MEGLUMINE & SODIUM 66-10 % PO SOLN
ORAL | Status: AC
  Administered 2016-06-10: 30 mL
  Filled 2016-06-10: qty 30

## 2016-06-10 MED ORDER — ONDANSETRON HCL 4 MG/2ML IJ SOLN
4.0000 mg | Freq: Once | INTRAMUSCULAR | Status: AC
  Administered 2016-06-10: 4 mg via INTRAVENOUS
  Filled 2016-06-10 (×2): qty 2

## 2016-06-10 MED ORDER — CIPROFLOXACIN IN D5W 400 MG/200ML IV SOLN
400.0000 mg | Freq: Once | INTRAVENOUS | Status: AC
  Administered 2016-06-10: 400 mg via INTRAVENOUS
  Filled 2016-06-10: qty 200

## 2016-06-10 NOTE — ED Notes (Signed)
Cipro and Flagyl stopped and disconnected from IV line at time of morphine administration. Saline Lock flushed post administration. IV site cleaned prior to re-starting IV abx.

## 2016-06-10 NOTE — ED Triage Notes (Signed)
Pt reports early Friday morning sudden onset of abdominal pain with diarrhea.  States Friday was running a fever.  Actually feels some better today, but continues to have abd pain around umbilicus and suprapubic area.

## 2016-06-10 NOTE — Discharge Instructions (Signed)
Prescription for 2 antibiotics and nausea medicine. Taking your pain medication as needed.  Clear liquids next12 hrs

## 2016-06-10 NOTE — ED Provider Notes (Addendum)
[facility]-EMER[facility]Y DEPT Provider Note   CSN: [id number] Arrival date & time: 06/10/16  1001   By signing my name below, I, Christel MormonMatthew Jamison, attest that this documentation has been prepared under the direction and in the presence of Donnetta HutchingBrian Cook, MD . Electronically Signed: Christel MormonMatthew Jamison, Scribe. 06/10/2016. 10:46 AM.   History   Chief Complaint Chief Complaint  Patient presents with  . Abdominal Pain    The history is provided by the patient. No language interpreter was used.   HPI Comments:  Kenneth [name] is a 45 y.o. male with PMHx of diverticulitis who presents to the Emergency Department complaining of sudden onset, recently improving abdominal pain x2 days. Pt states that he has had severe abdominal pain from the umbilicus down which he describes as "stabbing." Pt complains of associated decreased appetite, nausea, vomiting and diarrhea, and states that vomiting has since resolved.  Past Medical History:  Diagnosis Date  . Anxiety   . Diverticulitis   . GERD (gastroesophageal reflux disease)   . Hypertension     There are no active problems to display for this patient.   Past Surgical History:  Procedure Laterality Date  . BACK SURGERY    . CHONDROPLASTY Right 04/29/2015   Procedure: CHONDROPLASTY;  Surgeon: Loreta Aveaniel F Murphy, MD;  Location: [facility];  Service: Orthopedics;  Laterality: Right;  . KNEE ARTHROSCOPY WITH EXCISION PLICA Right 04/29/2015   Procedure: RIGHT KNEE ARTHROSCOPY, EXCISION OF PLICA, CHONDROPLASTY, PARTIAL MEDIAL MENISECTOMY AND PARTIAL LATERAL MENISECTOMY;  Surgeon: Loreta Aveaniel F Murphy, MD;  Location: [facility];  Service: Orthopedics;  Laterality: Right;  . KNEE ARTHROSCOPY WITH LATERAL MENISECTOMY Right 04/29/2015   Procedure: KNEE ARTHROSCOPY WITH LATERAL MENISECTOMY;  Surgeon: Loreta Aveaniel F Murphy, MD;  Location: [facility];  Service: Orthopedics;  Laterality: Right;  . KNEE ARTHROSCOPY WITH MEDIAL MENISECTOMY Right  04/29/2015   Procedure: KNEE ARTHROSCOPY WITH MEDIAL MENISECTOMY;  Surgeon: Loreta Aveaniel F Murphy, MD;  Location: [facility];  Service: Orthopedics;  Laterality: Right;  . KNEE SURGERY         Home Medications    Prior to Admission medications   Medication Sig Start Date End Date Taking? Authorizing Provider  AZOR 5-40 MG per tablet TAKE 1 TABLET BY MOUTH EVERY DAY 90 DAYS 03/12/15  Yes Historical Provider, MD  cholecalciferol (VITAMIN D) 1000 UNITS tablet Take 2,000 Units by mouth daily.    Yes Historical Provider, MD  escitalopram (LEXAPRO) 10 MG tablet Take 10 mg by mouth daily.     Yes Historical Provider, MD  esomeprazole (NEXIUM) 40 MG capsule Take 40 mg by mouth daily before breakfast.     Yes Historical Provider, MD  HYDROcodone-acetaminophen (NORCO) 10-325 MG per tablet TAKE 1/2 TO 1 TABLET BY MOUTH EVERY 6 HOURS AS NEEDED FOR PAIN 04/02/15  Yes Historical Provider, MD  metoprolol (TOPROL-XL) 100 MG 24 hr tablet Take 50 mg by mouth at bedtime.    Yes Historical Provider, MD  ciprofloxacin (CIPRO) 500 MG tablet Take 1 tablet (500 mg total) by mouth 2 (two) times daily. 06/10/16   Donnetta HutchingBrian Cook, MD  metroNIDAZOLE (FLAGYL) 500 MG tablet Take 1 tablet (500 mg total) by mouth 4 (four) times daily. 06/10/16   Donnetta HutchingBrian Cook, MD  ondansetron (ZOFRAN) 4 MG tablet Take 1 tablet (4 mg total) by mouth every 6 (six) hours. 06/10/16   Donnetta HutchingBrian Cook, MD    Family History History reviewed. No pertinent family history.  Social History Social History  Substance Use Topics  . Smoking status: Former Games developer  . Smokeless tobacco: Not on file  . Alcohol use No     Allergies   Review of patient's allergies indicates no known allergies.   Review of Systems Review of Systems  All other systems reviewed and are negative.    Physical Exam Updated Vital Signs BP (!) 180/115   Pulse 71   Temp 98.2 F (36.8 C) (Oral)   Resp 17   Ht 5\' 10"  (1.778 m)   Wt 205 lb (93 kg)   SpO2 99%   BMI  29.41 kg/m   Physical Exam  Constitutional: He is oriented to person, place, and time. He appears well-developed and well-nourished.  HENT:  Head: Normocephalic and atraumatic.  Eyes: Conjunctivae are normal.  Neck: Neck supple.  Cardiovascular: Normal rate and regular rhythm.   Pulmonary/Chest: Effort normal and breath sounds normal.  Abdominal:  Tenderness to mid-lower abdomen with some extension to RLQ and LLQ.   Musculoskeletal: Normal range of motion.  Neurological: He is alert and oriented to person, place, and time.  Skin: Skin is warm and dry.  Psychiatric: He has a normal mood and affect. His behavior is normal.  Nursing note and vitals reviewed.    ED Treatments / Results  DIAGNOSTIC STUDIES:  Oxygen Saturation is 100% on RA, normal by my interpretation.    COORDINATION OF CARE:  10:41 AM Will order CT of abdomen, pain medication, urinalysis, blood work, and IV fluids. Discussed treatment plan with pt at bedside and pt agreed to plan.   Labs (all labs ordered are listed, but only abnormal results are displayed) Labs Reviewed  COMPREHENSIVE METABOLIC PANEL - Abnormal; Notable for the following:       Result Value   Glucose, Bld 111 (*)    AST 14 (*)    All other components within normal limits  URINALYSIS, ROUTINE W REFLEX MICROSCOPIC (NOT AT [facility]) - Abnormal; Notable for the following:    Specific Gravity, Urine <1.005 (*)    All other components within normal limits  LIPASE, BLOOD  CBC    EKG  EKG Interpretation None       Radiology Ct Abdomen Pelvis W Contrast  Result Date: 06/10/2016 CLINICAL DATA:  Mid/lower abdominal pain x2 days, diarrhea, history of diverticulitis EXAM: CT ABDOMEN AND PELVIS WITH CONTRAST TECHNIQUE: Multidetector CT imaging of the abdomen and pelvis was performed using the standard protocol following bolus administration of intravenous contrast. CONTRAST:  ISOVUE-300 IOPAMIDOL (ISOVUE-300) INJECTION 61% COMPARISON:  None.  FINDINGS: Lower chest: Platelike scarring/ atelectasis in the right middle lobe. Linear scarring/ atelectasis in the lingula. Hepatobiliary: Liver is within normal limits. Gallbladder is unremarkable. No intrahepatic or extrahepatic ductal dilatation. Pancreas: Within normal limits. Spleen: Within normal limits. Adrenals/Urinary Tract: Adrenal glands are within normal limits. Kidneys are within normal limits.  No hydronephrosis. Bladder is within normal limits. Stomach/Bowel: Stomach is within normal limits. No evidence bowel obstruction. Normal appendix. Sigmoid diverticulosis with pericolonic inflammatory changes in the left lower quadrant (series 2/image 36), reflecting mild sigmoid diverticulitis. No drainable fluid collection/ abscess. No free air to suggest macroscopic perforation. Vascular/Lymphatic: No evidence of abdominal aortic aneurysm. No suspicious abdominopelvic lymphadenopathy. Reproductive: Prostate is unremarkable. Other: No abdominopelvic ascites. Small fat containing bilateral inguinal hernias (series 2/image 90). Musculoskeletal: Status post PLIF at L5-S1. IMPRESSION: Mild sigmoid diverticulitis. No drainable fluid collection/ abscess. No free air. Normal appendix. Electronically Signed   By: Charline Bills M.D.   On: 06/10/2016  12:23    Procedures Procedures (including critical care time)  Medications Ordered in ED Medications  ciprofloxacin (CIPRO) IVPB 400 mg (400 mg Intravenous New Bag/Given 06/10/16 1501)  metroNIDAZOLE (FLAGYL) IVPB 500 mg (500 mg Intravenous New Bag/Given 06/10/16 1501)  ondansetron (ZOFRAN) injection 4 mg (4 mg Intravenous Given 06/10/16 1134)  sodium chloride 0.9 % bolus 1,000 mL (1,000 mLs Intravenous New Bag/Given 06/10/16 1134)  morphine 4 MG/ML injection 4 mg (4 mg Intravenous Given 06/10/16 1134)  diatrizoate meglumine-sodium (GASTROGRAFIN) 66-10 % solution (30 mLs  Given 06/10/16 1130)  iopamidol (ISOVUE-300) 61 % injection 100 mL (100 mLs Intravenous  Contrast Given 06/10/16 1151)     Initial Impression / Assessment and Plan / ED Course  I have reviewed the triage vital signs and the nursing notes.  Pertinent labs & imaging results that were available during my care of the patient were reviewed by me and considered in my medical decision making (see chart for details).  Clinical Course    No acute abdomen. CT reveals sigmoid diverticulitis. IV Cipro, IV Flagyl. Patient is stable and can be treated as an outpatient. Findings were discussed with the patient and his wife. Discharge medications Cipro 500 mg, Flagyl 500 mg, Zofran 8 mg  Final Clinical Impressions(s) / ED Diagnoses   Final diagnoses:  Sigmoid diverticulitis    New Prescriptions New Prescriptions   CIPROFLOXACIN (CIPRO) 500 MG TABLET    Take 1 tablet (500 mg total) by mouth 2 (two) times daily.   METRONIDAZOLE (FLAGYL) 500 MG TABLET    Take 1 tablet (500 mg total) by mouth 4 (four) times daily.   ONDANSETRON (ZOFRAN) 4 MG TABLET    Take 1 tablet (4 mg total) by mouth every 6 (six) hours.  I personally performed the services described in this documentation, which was scribed in my presence. The recorded information has been reviewed and is accurate.      Donnetta Hutching, MD 06/10/16 1547    Donnetta Hutching, MD 06/11/16 0730

## 2016-06-10 NOTE — ED Notes (Signed)
Additional urine sample collected at this time. Specimen labeled and taken over to lab.

## 2016-06-10 NOTE — ED Notes (Signed)
Per 11am report, urine sample collected but was not labeled. Urine sample tossed out by Lab. Pt aware in need of additional urine sample.

## 2016-11-07 DIAGNOSIS — I10 Essential (primary) hypertension: Secondary | ICD-10-CM | POA: Diagnosis not present

## 2016-11-07 DIAGNOSIS — M5416 Radiculopathy, lumbar region: Secondary | ICD-10-CM | POA: Diagnosis not present

## 2016-11-07 DIAGNOSIS — M545 Low back pain: Secondary | ICD-10-CM | POA: Diagnosis not present

## 2017-01-03 DIAGNOSIS — I10 Essential (primary) hypertension: Secondary | ICD-10-CM | POA: Diagnosis not present

## 2017-01-31 DIAGNOSIS — M545 Low back pain: Secondary | ICD-10-CM | POA: Diagnosis not present

## 2017-01-31 DIAGNOSIS — M5416 Radiculopathy, lumbar region: Secondary | ICD-10-CM | POA: Diagnosis not present

## 2017-03-02 DIAGNOSIS — E559 Vitamin D deficiency, unspecified: Secondary | ICD-10-CM | POA: Diagnosis not present

## 2017-03-02 DIAGNOSIS — M5416 Radiculopathy, lumbar region: Secondary | ICD-10-CM | POA: Diagnosis not present

## 2017-03-02 DIAGNOSIS — I10 Essential (primary) hypertension: Secondary | ICD-10-CM | POA: Diagnosis not present

## 2017-03-02 DIAGNOSIS — Z79899 Other long term (current) drug therapy: Secondary | ICD-10-CM | POA: Diagnosis not present

## 2017-03-02 DIAGNOSIS — Z0000 Encounter for general adult medical examination without abnormal findings: Secondary | ICD-10-CM | POA: Diagnosis not present

## 2017-04-15 DIAGNOSIS — R030 Elevated blood-pressure reading, without diagnosis of hypertension: Secondary | ICD-10-CM | POA: Diagnosis not present

## 2017-05-18 DIAGNOSIS — M47816 Spondylosis without myelopathy or radiculopathy, lumbar region: Secondary | ICD-10-CM | POA: Diagnosis not present

## 2017-05-18 DIAGNOSIS — M5126 Other intervertebral disc displacement, lumbar region: Secondary | ICD-10-CM | POA: Diagnosis not present

## 2017-05-18 DIAGNOSIS — M5416 Radiculopathy, lumbar region: Secondary | ICD-10-CM | POA: Diagnosis not present

## 2017-05-23 DIAGNOSIS — G894 Chronic pain syndrome: Secondary | ICD-10-CM | POA: Diagnosis not present

## 2017-05-23 DIAGNOSIS — I10 Essential (primary) hypertension: Secondary | ICD-10-CM | POA: Diagnosis not present

## 2017-05-23 DIAGNOSIS — M545 Low back pain: Secondary | ICD-10-CM | POA: Diagnosis not present

## 2017-06-06 DIAGNOSIS — M5126 Other intervertebral disc displacement, lumbar region: Secondary | ICD-10-CM | POA: Diagnosis not present

## 2017-06-06 DIAGNOSIS — M5416 Radiculopathy, lumbar region: Secondary | ICD-10-CM | POA: Diagnosis not present

## 2017-07-12 DIAGNOSIS — G8929 Other chronic pain: Secondary | ICD-10-CM | POA: Diagnosis not present

## 2017-07-12 DIAGNOSIS — M25561 Pain in right knee: Secondary | ICD-10-CM | POA: Diagnosis not present

## 2017-07-12 DIAGNOSIS — M1712 Unilateral primary osteoarthritis, left knee: Secondary | ICD-10-CM | POA: Diagnosis not present

## 2017-07-18 ENCOUNTER — Other Ambulatory Visit (HOSPITAL_COMMUNITY): Payer: Self-pay | Admitting: Orthopedic Surgery

## 2017-07-18 DIAGNOSIS — Z96651 Presence of right artificial knee joint: Secondary | ICD-10-CM

## 2017-07-18 DIAGNOSIS — M25561 Pain in right knee: Secondary | ICD-10-CM

## 2017-07-23 ENCOUNTER — Encounter (HOSPITAL_COMMUNITY)
Admission: RE | Admit: 2017-07-23 | Discharge: 2017-07-23 | Disposition: A | Discharge status: Home / Self Care | Payer: 59 | Source: Ambulatory Visit | Attending: Orthopedic Surgery | Admitting: Orthopedic Surgery

## 2017-07-23 ENCOUNTER — Ambulatory Visit (HOSPITAL_COMMUNITY)
Admission: RE | Admit: 2017-07-23 | Discharge: 2017-07-23 | Disposition: A | Discharge status: Home / Self Care | Payer: 59 | Source: Ambulatory Visit | Attending: Orthopedic Surgery | Admitting: Orthopedic Surgery

## 2017-07-23 DIAGNOSIS — M25561 Pain in right knee: Secondary | ICD-10-CM | POA: Insufficient documentation

## 2017-07-23 DIAGNOSIS — Z96651 Presence of right artificial knee joint: Secondary | ICD-10-CM | POA: Diagnosis not present

## 2017-07-23 DIAGNOSIS — R948 Abnormal results of function studies of other organs and systems: Secondary | ICD-10-CM | POA: Diagnosis not present

## 2017-07-23 MED ORDER — TECHNETIUM TC 99M MEDRONATE IV KIT
20.0000 | PACK | Freq: Once | INTRAVENOUS | Status: AC | PRN
  Administered 2017-07-23: 20 via INTRAVENOUS

## 2017-08-22 DIAGNOSIS — M545 Low back pain: Secondary | ICD-10-CM | POA: Diagnosis not present

## 2017-08-22 DIAGNOSIS — G894 Chronic pain syndrome: Secondary | ICD-10-CM | POA: Diagnosis not present

## 2017-08-22 DIAGNOSIS — I10 Essential (primary) hypertension: Secondary | ICD-10-CM | POA: Diagnosis not present

## 2017-08-31 ENCOUNTER — Other Ambulatory Visit: Payer: Self-pay | Admitting: Internal Medicine

## 2017-09-01 ENCOUNTER — Other Ambulatory Visit: Payer: Self-pay | Admitting: Internal Medicine

## 2017-09-01 DIAGNOSIS — M5416 Radiculopathy, lumbar region: Secondary | ICD-10-CM

## 2017-09-01 DIAGNOSIS — M545 Low back pain: Secondary | ICD-10-CM

## 2017-09-10 ENCOUNTER — Ambulatory Visit
Admission: RE | Admit: 2017-09-10 | Discharge: 2017-09-10 | Disposition: A | Discharge status: Home / Self Care | Payer: 59 | Source: Ambulatory Visit | Attending: Internal Medicine | Admitting: Internal Medicine

## 2017-09-10 DIAGNOSIS — M545 Low back pain: Secondary | ICD-10-CM

## 2017-09-10 DIAGNOSIS — M48061 Spinal stenosis, lumbar region without neurogenic claudication: Secondary | ICD-10-CM | POA: Diagnosis not present

## 2017-09-10 DIAGNOSIS — M5416 Radiculopathy, lumbar region: Secondary | ICD-10-CM

## 2017-09-13 ENCOUNTER — Other Ambulatory Visit: Payer: Self-pay | Admitting: Internal Medicine

## 2017-10-30 DIAGNOSIS — R05 Cough: Secondary | ICD-10-CM | POA: Diagnosis not present

## 2017-10-30 DIAGNOSIS — J3489 Other specified disorders of nose and nasal sinuses: Secondary | ICD-10-CM | POA: Diagnosis not present

## 2019-02-06 ENCOUNTER — Other Ambulatory Visit: Payer: Self-pay | Admitting: Neurosurgery

## 2019-02-06 DIAGNOSIS — G9519 Other vascular myelopathies: Secondary | ICD-10-CM

## 2019-02-06 DIAGNOSIS — M48062 Spinal stenosis, lumbar region with neurogenic claudication: Secondary | ICD-10-CM

## 2019-02-11 ENCOUNTER — Telehealth: Payer: Self-pay

## 2019-02-11 NOTE — Telephone Encounter (Signed)
Spoke with patient to screen his medications and allergies before he is scheduled for a myelogram.  He was informed he will be here 2-2.5 hours, needs a driver and needs to be on bedrest for 24 hours after the procedure.  He also was informed he needs to hold Lexapro 48 hours before, and 24 hours after, the procedure.

## 2019-03-07 ENCOUNTER — Other Ambulatory Visit: Payer: 59

## 2019-03-28 ENCOUNTER — Ambulatory Visit
Admission: RE | Admit: 2019-03-28 | Discharge: 2019-03-28 | Disposition: A | Discharge status: Home / Self Care | Payer: 59 | Source: Ambulatory Visit | Attending: Neurosurgery | Admitting: Neurosurgery

## 2019-03-28 ENCOUNTER — Other Ambulatory Visit: Payer: Self-pay

## 2019-03-28 DIAGNOSIS — M48062 Spinal stenosis, lumbar region with neurogenic claudication: Secondary | ICD-10-CM

## 2019-03-28 DIAGNOSIS — G9519 Other vascular myelopathies: Secondary | ICD-10-CM

## 2019-03-28 MED ORDER — IOPAMIDOL (ISOVUE-M 200) INJECTION 41%
18.0000 mL | Freq: Once | INTRAMUSCULAR | Status: AC
  Administered 2019-03-28: 11:00:00 18 mL via INTRATHECAL

## 2019-03-28 MED ORDER — DIAZEPAM 5 MG PO TABS
5.0000 mg | ORAL_TABLET | Freq: Once | ORAL | Status: AC
  Administered 2019-03-28: 5 mg via ORAL

## 2019-03-28 NOTE — Discharge Instructions (Signed)
Myelogram Discharge Instructions  1. Go home and rest quietly for the next 24 hours.  It is important to lie flat for the next 24 hours.  Get up only to go to the restroom.  You may lie in the bed or on a couch on your back, your stomach, your left side or your right side.  You may have one pillow under your head.  You may have pillows between your knees while you are on your side or under your knees while you are on your back.  2. DO NOT drive today.  Recline the seat as far back as it will go, while still wearing your seat belt, on the way home.  3. You may get up to go to the bathroom as needed.  You may sit up for 10 minutes to eat.  You may resume your normal diet and medications unless otherwise indicated.  Drink lots of extra fluids today and tomorrow.  4. The incidence of headache, nausea, or vomiting is about 5% (one in 20 patients).  If you develop a headache, lie flat and drink plenty of fluids until the headache goes away.  Caffeinated beverages may be helpful.  If you develop severe nausea and vomiting or a headache that does not go away with flat bed rest, call [phone].  5. You may resume normal activities after your 24 hours of bed rest is over; however, do not exert yourself strongly or do any heavy lifting tomorrow. If when you get up you have a headache when standing, go back to bed and force fluids for another 24 hours.  6. Call your physician for a follow-up appointment.  The results of your myelogram will be sent directly to your physician by the following day.  7. If you have any questions or if complications develop after you arrive home, please call [phone].  Discharge instructions have been explained to the patient.  The patient, or the person responsible for the patient, fully understands these instructions.  YOU MAY RESTART YOUR LEXAPRO TOMORROW 03/29/2019 AT 10:00AM.

## 2019-06-24 ENCOUNTER — Other Ambulatory Visit: Payer: Self-pay | Admitting: Neurosurgery

## 2019-07-08 ENCOUNTER — Other Ambulatory Visit: Payer: Self-pay

## 2019-07-08 ENCOUNTER — Encounter (HOSPITAL_COMMUNITY)
Admission: RE | Admit: 2019-07-08 | Discharge: 2019-07-08 | Disposition: A | Discharge status: Home / Self Care | Payer: 59 | Source: Ambulatory Visit | Attending: Neurosurgery | Admitting: Neurosurgery

## 2019-07-08 ENCOUNTER — Encounter (HOSPITAL_COMMUNITY): Payer: Self-pay

## 2019-07-08 DIAGNOSIS — Z01818 Encounter for other preprocedural examination: Secondary | ICD-10-CM | POA: Insufficient documentation

## 2019-07-08 HISTORY — DX: Unspecified osteoarthritis, unspecified site: M19.90

## 2019-07-08 LAB — BASIC METABOLIC PANEL
Anion gap: 9 (ref 5–15)
BUN: 10 mg/dL (ref 6–20)
CO2: 27 mmol/L (ref 22–32)
Calcium: 9.4 mg/dL (ref 8.9–10.3)
Chloride: 105 mmol/L (ref 98–111)
Creatinine, Ser: 0.94 mg/dL (ref 0.61–1.24)
GFR calc Af Amer: >60 mL/min (ref >60)
GFR calc non Af Amer: >60 mL/min (ref >60)
Glucose, Bld: 126 mg/dL — ABNORMAL HIGH (ref 70–99)
Potassium: 4.6 mmol/L (ref 3.5–5.1)
Sodium: 141 mmol/L (ref 135–145)

## 2019-07-08 LAB — CBC
HCT: 42.6 % (ref 39.0–52.0)
Hemoglobin: 14.9 g/dL (ref 13.0–17.0)
MCH: 33.3 pg (ref 26.0–34.0)
MCHC: 35 g/dL (ref 30.0–36.0)
MCV: 95.1 fL (ref 80.0–100.0)
Platelets: 275 10*3/uL (ref 150–400)
RBC: 4.48 MIL/uL (ref 4.22–5.81)
RDW: 11.9 % (ref 11.5–15.5)
WBC: 5.9 10*3/uL (ref 4.0–10.5)
nRBC: 0 % (ref 0.0–0.2)

## 2019-07-08 LAB — TYPE AND SCREEN
ABO/RH(D): O POS
Antibody Screen: NEGATIVE

## 2019-07-08 LAB — SURGICAL PCR SCREEN
MRSA, PCR: NEGATIVE
Staphylococcus aureus: NEGATIVE

## 2019-07-08 LAB — ABO/RH: ABO/RH(D): O POS

## 2019-07-08 NOTE — Progress Notes (Signed)
PCP - Josetta Huddle, MD Cardiologist -   Chest x-ray - N/A EKG - 07/08/19 Stress Test - denies ECHO - denies Cardiac Cath - denies  Sleep Study - years ago; states not diagnosed w/sleep apnea CPAP - N/A  Blood Thinner Instructions: N/A Aspirin Instructions: N/A  COVID TEST- scheduled 07/14/19 at [facility], 0805  Coronavirus Screening  Have you experienced the following symptoms:  Cough yes/no: No Fever (>100.67F)  yes/no: No Runny nose yes/no: No Sore throat yes/no: No Difficulty breathing/shortness of breath  yes/no: No  Have you or a family member traveled in the last 14 days and where? yes/no: No  If the patient indicates "YES" to the above questions, their PAT will be rescheduled to limit the exposure to others and, the surgeon will be notified. THE PATIENT WILL NEED TO BE ASYMPTOMATIC FOR 14 DAYS.   If the patient is not experiencing any of these symptoms, the PAT nurse will instruct them to NOT bring anyone with them to their appointment since they may have these symptoms or traveled as well.   Please remind your patients and families that hospital visitation restrictions are in effect and the importance of the restrictions.   Anesthesia review: No  Patient denies shortness of breath, fever, cough and chest pain at PAT appointment   All instructions explained to the patient, with a verbal understanding of the material. Patient agrees to go over the instructions while at home for a better understanding. Patient also instructed to self quarantine after being tested for COVID-19. The opportunity to ask questions was provided.

## 2019-07-08 NOTE — Progress Notes (Signed)
[id number] [place], [place] - [address] AT [facility] [address] [place] [address] Phone: [phone] Fax: [phone]      Your procedure is scheduled on October 28th, 2020.  Report to [facility] Main Entrance "A" at 5:30 A.M., and check in at the Admitting office.   Call this number if you have problems the morning of surgery:  [phone]  Call [phone] if you have any questions prior to your surgery date Monday-Friday 8am-4pm    Remember:  Do not eat or drink after midnight the night before your surgery   Take these medicines the morning of surgery with A SIP OF WATER:  Escitalopram (Lexapro) Esomeprazole (Nexium) Hydralazine (Apresoline) Hydrocodone-Acetaminophen (Norco) - if needed  7 days prior to surgery STOP taking any Aspirin (unless otherwise instructed by your surgeon), Aleve, Naproxen, Ibuprofen, Motrin, Advil, Goody's, BC's, all herbal medications, fish oil, and all vitamins.    The Morning of Surgery  Do not wear jewelry, make-up or nail polish.  Do not wear lotions, powders, or perfumes/colognes, or deodorant  Do not shave 48 hours prior to surgery.  Men may shave face and neck.  Do not bring valuables to the hospital.  [facility] is not responsible for any belongings or valuables.  If you are a smoker, DO NOT Smoke 24 hours prior to surgery IF you wear a CPAP at night please bring your mask, tubing, and machine the morning of surgery   Remember that you must have someone to transport you home after your surgery, and remain with you for 24 hours if you are discharged the same day.   Contacts, glasses, hearing aids, dentures or bridgework may not be worn into surgery.    Leave your suitcase in the car.  After surgery it may be brought to your room.  For patients admitted to the hospital, discharge time will be determined by your treatment team.  Patients discharged the day of surgery  will not be allowed to drive home.    Special instructions:   [facility]- Preparing For Surgery  Before surgery, you can play an important role. Because skin is not sterile, your skin needs to be as free of germs as possible. You can reduce the number of germs on your skin by washing with CHG (chlorahexidine gluconate) Soap before surgery.  CHG is an antiseptic cleaner which kills germs and bonds with the skin to continue killing germs even after washing.    Oral Hygiene is also important to reduce your risk of infection.  Remember - BRUSH YOUR TEETH THE MORNING OF SURGERY WITH YOUR REGULAR TOOTHPASTE  Please do not use if you have an allergy to CHG or antibacterial soaps. If your skin becomes reddened/irritated stop using the CHG.  Do not shave (including legs and underarms) for at least 48 hours prior to first CHG shower. It is OK to shave your face.  Please follow these instructions carefully.   1. Shower the NIGHT BEFORE SURGERY and the MORNING OF SURGERY with CHG Soap.   2. If you chose to wash your hair, wash your hair first as usual with your normal shampoo.  3. After you shampoo, rinse your hair and body thoroughly to remove the shampoo.  4. Use CHG as you would any other liquid soap. You can apply CHG directly to the skin and wash gently with a scrungie or a clean washcloth.   5. Apply the CHG Soap to your body  ONLY FROM THE NECK DOWN.  Do not use on open wounds or open sores. Avoid contact with your eyes, ears, mouth and genitals (private parts). Wash Face and genitals (private parts)  with your normal soap.   6. Wash thoroughly, paying special attention to the area where your surgery will be performed.  7. Thoroughly rinse your body with warm water from the neck down.  8. DO NOT shower/wash with your normal soap after using and rinsing off the CHG Soap.  9. Pat yourself dry with a CLEAN TOWEL.  10. Wear CLEAN PAJAMAS to bed the night before surgery, wear comfortable  clothes the morning of surgery  11. Place CLEAN SHEETS on your bed the night of your first shower and DO NOT SLEEP WITH PETS.    Day of Surgery:  Do not apply any deodorants/lotions. Please shower the morning of surgery with the CHG soap  Please wear clean clothes to the hospital/surgery center.   Remember to brush your teeth WITH YOUR REGULAR TOOTHPASTE.   Please read over the following fact sheets that you were given.

## 2019-07-14 ENCOUNTER — Other Ambulatory Visit (HOSPITAL_COMMUNITY)
Admission: RE | Admit: 2019-07-14 | Discharge: 2019-07-14 | Disposition: A | Discharge status: Home / Self Care | Payer: 59 | Source: Ambulatory Visit | Attending: Neurosurgery | Admitting: Neurosurgery

## 2019-07-14 LAB — SARS CORONAVIRUS 2 (TAT 6-24 HRS): SARS Coronavirus 2: NEGATIVE

## 2019-07-15 ENCOUNTER — Other Ambulatory Visit: Payer: Self-pay | Admitting: Neurosurgery

## 2019-07-16 ENCOUNTER — Other Ambulatory Visit: Payer: Self-pay

## 2019-07-16 ENCOUNTER — Inpatient Hospital Stay (HOSPITAL_COMMUNITY): Payer: 59 | Admitting: Certified Registered Nurse Anesthetist

## 2019-07-16 ENCOUNTER — Inpatient Hospital Stay (HOSPITAL_COMMUNITY): Payer: 59 | Admitting: Vascular Surgery

## 2019-07-16 ENCOUNTER — Inpatient Hospital Stay (HOSPITAL_COMMUNITY)
Admission: RE | Admit: 2019-07-16 | Discharge: 2019-07-17 | DRG: 455 | Disposition: A | Discharge status: Home / Self Care | Payer: 59 | Attending: Neurosurgery | Admitting: Neurosurgery

## 2019-07-16 ENCOUNTER — Encounter (HOSPITAL_COMMUNITY)
Admission: RE | Disposition: A | Discharge status: Home / Self Care | Payer: Self-pay | Source: Home / Self Care | Attending: Neurosurgery

## 2019-07-16 ENCOUNTER — Inpatient Hospital Stay (HOSPITAL_COMMUNITY): Payer: 59

## 2019-07-16 DIAGNOSIS — Z79899 Other long term (current) drug therapy: Secondary | ICD-10-CM | POA: Diagnosis not present

## 2019-07-16 DIAGNOSIS — Z20828 Contact with and (suspected) exposure to other viral communicable diseases: Secondary | ICD-10-CM | POA: Diagnosis present

## 2019-07-16 DIAGNOSIS — M48061 Spinal stenosis, lumbar region without neurogenic claudication: Secondary | ICD-10-CM | POA: Diagnosis present

## 2019-07-16 DIAGNOSIS — Z981 Arthrodesis status: Secondary | ICD-10-CM

## 2019-07-16 DIAGNOSIS — K219 Gastro-esophageal reflux disease without esophagitis: Secondary | ICD-10-CM | POA: Diagnosis present

## 2019-07-16 DIAGNOSIS — M5116 Intervertebral disc disorders with radiculopathy, lumbar region: Secondary | ICD-10-CM | POA: Diagnosis present

## 2019-07-16 DIAGNOSIS — Z87891 Personal history of nicotine dependence: Secondary | ICD-10-CM

## 2019-07-16 DIAGNOSIS — M1990 Unspecified osteoarthritis, unspecified site: Secondary | ICD-10-CM | POA: Diagnosis present

## 2019-07-16 DIAGNOSIS — M48062 Spinal stenosis, lumbar region with neurogenic claudication: Secondary | ICD-10-CM | POA: Diagnosis present

## 2019-07-16 DIAGNOSIS — Z419 Encounter for procedure for purposes other than remedying health state, unspecified: Secondary | ICD-10-CM

## 2019-07-16 DIAGNOSIS — I10 Essential (primary) hypertension: Secondary | ICD-10-CM | POA: Diagnosis present

## 2019-07-16 SURGERY — POSTERIOR LUMBAR FUSION 1 LEVEL
Anesthesia: General | Site: Spine Lumbar

## 2019-07-16 MED ORDER — HYDROMORPHONE HCL 1 MG/ML IJ SOLN
INTRAMUSCULAR | Status: AC
  Administered 2019-07-16: 0.5 mg
  Filled 2019-07-16: qty 1

## 2019-07-16 MED ORDER — GABAPENTIN 300 MG PO CAPS
300.0000 mg | ORAL_CAPSULE | Freq: Every day | ORAL | Status: DC
  Administered 2019-07-16: 300 mg via ORAL
  Filled 2019-07-16: qty 1

## 2019-07-16 MED ORDER — MIDAZOLAM HCL 2 MG/2ML IJ SOLN
INTRAMUSCULAR | Status: AC
  Filled 2019-07-16: qty 2

## 2019-07-16 MED ORDER — ONDANSETRON HCL 4 MG/2ML IJ SOLN: 4.0000 mg | Freq: Four times a day (QID) | INTRAMUSCULAR | Status: DC | PRN

## 2019-07-16 MED ORDER — BUPIVACAINE LIPOSOME 1.3 % IJ SUSP
20.0000 mL | Freq: Once | INTRAMUSCULAR | Status: AC
  Administered 2019-07-16: 20 mL
  Filled 2019-07-16: qty 20

## 2019-07-16 MED ORDER — MIDAZOLAM HCL 2 MG/2ML IJ SOLN
INTRAMUSCULAR | Status: DC | PRN
  Administered 2019-07-16: 2 mg via INTRAVENOUS

## 2019-07-16 MED ORDER — PHENYLEPHRINE 40 MCG/ML (10ML) SYRINGE FOR IV PUSH (FOR BLOOD PRESSURE SUPPORT)
PREFILLED_SYRINGE | INTRAVENOUS | Status: DC | PRN
  Administered 2019-07-16: 80 ug via INTRAVENOUS

## 2019-07-16 MED ORDER — EPHEDRINE SULFATE-NACL 50-0.9 MG/10ML-% IV SOSY
PREFILLED_SYRINGE | INTRAVENOUS | Status: DC | PRN
  Administered 2019-07-16: 10 mg via INTRAVENOUS

## 2019-07-16 MED ORDER — FENTANYL CITRATE (PF) 250 MCG/5ML IJ SOLN
INTRAMUSCULAR | Status: DC | PRN
  Administered 2019-07-16 (×6): 50 ug via INTRAVENOUS

## 2019-07-16 MED ORDER — ONDANSETRON HCL 4 MG/2ML IJ SOLN
INTRAMUSCULAR | Status: DC | PRN
  Administered 2019-07-16: 4 mg via INTRAVENOUS

## 2019-07-16 MED ORDER — HYDROMORPHONE HCL 1 MG/ML IJ SOLN
INTRAMUSCULAR | Status: AC
  Filled 2019-07-16: qty 1

## 2019-07-16 MED ORDER — HYDROCODONE-ACETAMINOPHEN 10-325 MG PO TABS
1.0000 | ORAL_TABLET | ORAL | Status: DC | PRN
  Administered 2019-07-17 (×4): 2 via ORAL
  Filled 2019-07-16 (×4): qty 2

## 2019-07-16 MED ORDER — CEFAZOLIN SODIUM-DEXTROSE 2-4 GM/100ML-% IV SOLN
2.0000 g | INTRAVENOUS | Status: AC
  Administered 2019-07-16: 2 g via INTRAVENOUS
  Filled 2019-07-16: qty 100

## 2019-07-16 MED ORDER — HYDRALAZINE HCL 50 MG PO TABS
50.0000 mg | ORAL_TABLET | Freq: Two times a day (BID) | ORAL | Status: DC
  Administered 2019-07-16: 50 mg via ORAL
  Filled 2019-07-16 (×3): qty 1

## 2019-07-16 MED ORDER — HYDROMORPHONE HCL 1 MG/ML IJ SOLN
0.2500 mg | INTRAMUSCULAR | Status: DC | PRN
  Administered 2019-07-16 (×4): 0.5 mg via INTRAVENOUS

## 2019-07-16 MED ORDER — PROPOFOL 10 MG/ML IV BOLUS
INTRAVENOUS | Status: AC
  Filled 2019-07-16: qty 40

## 2019-07-16 MED ORDER — OXYCODONE HCL 5 MG PO TABS
10.0000 mg | ORAL_TABLET | ORAL | Status: DC | PRN
  Administered 2019-07-16 (×2): 10 mg via ORAL
  Filled 2019-07-16 (×2): qty 2

## 2019-07-16 MED ORDER — LIDOCAINE 2% (20 MG/ML) 5 ML SYRINGE
INTRAMUSCULAR | Status: DC | PRN
  Administered 2019-07-16: 100 mg via INTRAVENOUS

## 2019-07-16 MED ORDER — ACETAMINOPHEN 500 MG PO TABS
ORAL_TABLET | ORAL | Status: AC
  Filled 2019-07-16: qty 2

## 2019-07-16 MED ORDER — PHENYLEPHRINE HCL-NACL 10-0.9 MG/250ML-% IV SOLN
INTRAVENOUS | Status: DC | PRN
  Administered 2019-07-16: 25 ug/min via INTRAVENOUS

## 2019-07-16 MED ORDER — LIDOCAINE 2% (20 MG/ML) 5 ML SYRINGE
INTRAMUSCULAR | Status: AC
  Filled 2019-07-16: qty 5

## 2019-07-16 MED ORDER — MENTHOL 3 MG MT LOZG: 1.0000 | LOZENGE | OROMUCOSAL | Status: DC | PRN

## 2019-07-16 MED ORDER — ACETAMINOPHEN 325 MG PO TABS
650.0000 mg | ORAL_TABLET | ORAL | Status: DC | PRN
  Administered 2019-07-16: 650 mg via ORAL
  Filled 2019-07-16: qty 2

## 2019-07-16 MED ORDER — SODIUM CHLORIDE 0.9 % IV SOLN
INTRAVENOUS | Status: DC | PRN
  Administered 2019-07-16: 500 mL

## 2019-07-16 MED ORDER — SUCCINYLCHOLINE CHLORIDE 200 MG/10ML IV SOSY
PREFILLED_SYRINGE | INTRAVENOUS | Status: AC
  Filled 2019-07-16: qty 10

## 2019-07-16 MED ORDER — DEXAMETHASONE SODIUM PHOSPHATE 10 MG/ML IJ SOLN
INTRAMUSCULAR | Status: DC | PRN
  Administered 2019-07-16: 10 mg via INTRAVENOUS

## 2019-07-16 MED ORDER — BUPIVACAINE-EPINEPHRINE (PF) 0.5% -1:200000 IJ SOLN
INTRAMUSCULAR | Status: DC | PRN
  Administered 2019-07-16: 10 mL

## 2019-07-16 MED ORDER — SUGAMMADEX SODIUM 200 MG/2ML IV SOLN
INTRAVENOUS | Status: DC | PRN
  Administered 2019-07-16: 300 mg via INTRAVENOUS

## 2019-07-16 MED ORDER — OXYCODONE HCL 5 MG PO TABS
ORAL_TABLET | ORAL | Status: AC
  Filled 2019-07-16: qty 1

## 2019-07-16 MED ORDER — ESCITALOPRAM OXALATE 10 MG PO TABS
10.0000 mg | ORAL_TABLET | Freq: Every day | ORAL | Status: DC
  Administered 2019-07-17: 10 mg via ORAL
  Filled 2019-07-16: qty 1

## 2019-07-16 MED ORDER — CEFAZOLIN SODIUM-DEXTROSE 2-4 GM/100ML-% IV SOLN
2.0000 g | Freq: Three times a day (TID) | INTRAVENOUS | Status: AC
  Administered 2019-07-16 (×2): 2 g via INTRAVENOUS
  Filled 2019-07-16 (×2): qty 100

## 2019-07-16 MED ORDER — THROMBIN 5000 UNITS EX SOLR
CUTANEOUS | Status: AC
  Filled 2019-07-16: qty 5000

## 2019-07-16 MED ORDER — BISACODYL 10 MG RE SUPP: 10.0000 mg | Freq: Every day | RECTAL | Status: DC | PRN

## 2019-07-16 MED ORDER — SODIUM CHLORIDE 0.9% FLUSH: 3.0000 mL | INTRAVENOUS | Status: DC | PRN

## 2019-07-16 MED ORDER — BACITRACIN ZINC 500 UNIT/GM EX OINT
TOPICAL_OINTMENT | CUTANEOUS | Status: DC | PRN
  Administered 2019-07-16: 1 via TOPICAL

## 2019-07-16 MED ORDER — ACETAMINOPHEN 500 MG PO TABS
1000.0000 mg | ORAL_TABLET | Freq: Four times a day (QID) | ORAL | Status: AC
  Administered 2019-07-16 (×2): 1000 mg via ORAL
  Filled 2019-07-16: qty 2

## 2019-07-16 MED ORDER — METOPROLOL SUCCINATE ER 100 MG PO TB24
100.0000 mg | ORAL_TABLET | Freq: Every day | ORAL | Status: DC
  Administered 2019-07-16: 100 mg via ORAL
  Filled 2019-07-16: qty 1

## 2019-07-16 MED ORDER — PANTOPRAZOLE SODIUM 40 MG PO TBEC
80.0000 mg | DELAYED_RELEASE_TABLET | Freq: Every day | ORAL | Status: DC
  Administered 2019-07-17: 80 mg via ORAL
  Filled 2019-07-16: qty 2

## 2019-07-16 MED ORDER — SUCCINYLCHOLINE CHLORIDE 200 MG/10ML IV SOSY
PREFILLED_SYRINGE | INTRAVENOUS | Status: DC | PRN
  Administered 2019-07-16: 100 mg via INTRAVENOUS

## 2019-07-16 MED ORDER — DEXAMETHASONE SODIUM PHOSPHATE 10 MG/ML IJ SOLN
INTRAMUSCULAR | Status: AC
  Filled 2019-07-16: qty 1

## 2019-07-16 MED ORDER — MORPHINE SULFATE (PF) 4 MG/ML IV SOLN
4.0000 mg | INTRAVENOUS | Status: DC | PRN
  Administered 2019-07-16 – 2019-07-17 (×2): 4 mg via INTRAVENOUS
  Filled 2019-07-16 (×2): qty 1

## 2019-07-16 MED ORDER — PROPOFOL 10 MG/ML IV BOLUS
INTRAVENOUS | Status: DC | PRN
  Administered 2019-07-16: 160 mg via INTRAVENOUS

## 2019-07-16 MED ORDER — DOCUSATE SODIUM 100 MG PO CAPS
100.0000 mg | ORAL_CAPSULE | Freq: Two times a day (BID) | ORAL | Status: DC
  Administered 2019-07-16 – 2019-07-17 (×2): 100 mg via ORAL
  Filled 2019-07-16 (×2): qty 1

## 2019-07-16 MED ORDER — ACETAMINOPHEN 650 MG RE SUPP: 650.0000 mg | RECTAL | Status: DC | PRN

## 2019-07-16 MED ORDER — CHLORHEXIDINE GLUCONATE CLOTH 2 % EX PADS: 6.0000 | MEDICATED_PAD | Freq: Once | CUTANEOUS | Status: DC

## 2019-07-16 MED ORDER — PHENOL 1.4 % MT LIQD: 1.0000 | OROMUCOSAL | Status: DC | PRN

## 2019-07-16 MED ORDER — PHENYLEPHRINE 40 MCG/ML (10ML) SYRINGE FOR IV PUSH (FOR BLOOD PRESSURE SUPPORT)
PREFILLED_SYRINGE | INTRAVENOUS | Status: AC
  Filled 2019-07-16: qty 10

## 2019-07-16 MED ORDER — EPHEDRINE 5 MG/ML INJ
INTRAVENOUS | Status: AC
  Filled 2019-07-16: qty 10

## 2019-07-16 MED ORDER — OXYCODONE HCL 5 MG PO TABS
5.0000 mg | ORAL_TABLET | ORAL | Status: DC | PRN
  Administered 2019-07-16: 5 mg via ORAL

## 2019-07-16 MED ORDER — BUPIVACAINE-EPINEPHRINE 0.5% -1:200000 IJ SOLN
INTRAMUSCULAR | Status: AC
  Filled 2019-07-16: qty 1

## 2019-07-16 MED ORDER — LACTATED RINGERS IV SOLN
INTRAVENOUS | Status: DC | PRN
  Administered 2019-07-16 (×2): via INTRAVENOUS

## 2019-07-16 MED ORDER — HYDROMORPHONE HCL 1 MG/ML IJ SOLN
1.0000 mg | Freq: Once | INTRAMUSCULAR | Status: AC
  Administered 2019-07-16: 0.5 mg via INTRAVENOUS

## 2019-07-16 MED ORDER — ONDANSETRON HCL 4 MG PO TABS: 4.0000 mg | ORAL_TABLET | Freq: Four times a day (QID) | ORAL | Status: DC | PRN

## 2019-07-16 MED ORDER — THROMBIN 5000 UNITS EX SOLR
OROMUCOSAL | Status: DC | PRN
  Administered 2019-07-16: 5 mL via TOPICAL

## 2019-07-16 MED ORDER — SODIUM CHLORIDE 0.9% FLUSH
3.0000 mL | Freq: Two times a day (BID) | INTRAVENOUS | Status: DC
  Administered 2019-07-16: 3 mL via INTRAVENOUS

## 2019-07-16 MED ORDER — CYCLOBENZAPRINE HCL 10 MG PO TABS
10.0000 mg | ORAL_TABLET | Freq: Three times a day (TID) | ORAL | Status: DC | PRN
  Administered 2019-07-16 – 2019-07-17 (×2): 10 mg via ORAL
  Filled 2019-07-16 (×2): qty 1

## 2019-07-16 MED ORDER — FENTANYL CITRATE (PF) 250 MCG/5ML IJ SOLN
INTRAMUSCULAR | Status: AC
  Filled 2019-07-16: qty 10

## 2019-07-16 MED ORDER — ROCURONIUM BROMIDE 10 MG/ML (PF) SYRINGE
PREFILLED_SYRINGE | INTRAVENOUS | Status: DC | PRN
  Administered 2019-07-16: 50 mg via INTRAVENOUS
  Administered 2019-07-16: 100 mg via INTRAVENOUS

## 2019-07-16 MED ORDER — SODIUM CHLORIDE 0.9 % IV SOLN
250.0000 mL | INTRAVENOUS | Status: DC
  Administered 2019-07-16: 250 mL via INTRAVENOUS

## 2019-07-16 MED ORDER — GLYCOPYRROLATE PF 0.2 MG/ML IJ SOSY
PREFILLED_SYRINGE | INTRAMUSCULAR | Status: AC
  Filled 2019-07-16: qty 1

## 2019-07-16 MED ORDER — BACITRACIN ZINC 500 UNIT/GM EX OINT
TOPICAL_OINTMENT | CUTANEOUS | Status: AC
  Filled 2019-07-16: qty 28.35

## 2019-07-16 MED ORDER — DEXMEDETOMIDINE HCL 200 MCG/2ML IV SOLN
INTRAVENOUS | Status: DC | PRN
  Administered 2019-07-16: 16 ug via INTRAVENOUS
  Administered 2019-07-16: 8 ug via INTRAVENOUS
  Administered 2019-07-16: 16 ug via INTRAVENOUS

## 2019-07-16 MED ORDER — 0.9 % SODIUM CHLORIDE (POUR BTL) OPTIME
TOPICAL | Status: DC | PRN
  Administered 2019-07-16: 1000 mL

## 2019-07-16 MED ORDER — ROCURONIUM BROMIDE 10 MG/ML (PF) SYRINGE
PREFILLED_SYRINGE | INTRAVENOUS | Status: AC
  Filled 2019-07-16: qty 10

## 2019-07-16 SURGICAL SUPPLY — 69 items
APL SKNCLS STERI-STRIP NONHPOA (GAUZE/BANDAGES/DRESSINGS) ×1
BAG DECANTER FOR FLEXI CONT (MISCELLANEOUS) ×2 IMPLANT
BENZOIN TINCTURE PRP APPL 2/3 (GAUZE/BANDAGES/DRESSINGS) ×2 IMPLANT
BLADE CLIPPER SURG (BLADE) IMPLANT
BUR MATCHSTICK NEURO 3.0 LAGG (BURR) ×2 IMPLANT
BUR PRECISION FLUTE 6.0 (BURR) ×2 IMPLANT
CANISTER SUCT 3000ML PPV (MISCELLANEOUS) ×2 IMPLANT
CARTRIDGE OIL MAESTRO DRILL (MISCELLANEOUS) ×1 IMPLANT
CONT SPEC 4OZ CLIKSEAL STRL BL (MISCELLANEOUS) ×2 IMPLANT
COVER BACK TABLE 60X90IN (DRAPES) ×2 IMPLANT
COVER WAND RF STERILE (DRAPES) ×1 IMPLANT
DECANTER SPIKE VIAL GLASS SM (MISCELLANEOUS) ×2 IMPLANT
DIFFUSER DRILL AIR PNEUMATIC (MISCELLANEOUS) ×2 IMPLANT
DRAPE C-ARM 42X72 X-RAY (DRAPES) ×4 IMPLANT
DRAPE HALF SHEET 40X57 (DRAPES) ×2 IMPLANT
DRAPE LAPAROTOMY 100X72X124 (DRAPES) ×2 IMPLANT
DRAPE SURG 17X23 STRL (DRAPES) ×8 IMPLANT
DRSG OPSITE POSTOP 4X6 (GAUZE/BANDAGES/DRESSINGS) ×2 IMPLANT
ELECT BLADE 4.0 EZ CLEAN MEGAD (MISCELLANEOUS) ×2
ELECT REM PT RETURN 9FT ADLT (ELECTROSURGICAL) ×2
ELECTRODE BLDE 4.0 EZ CLN MEGD (MISCELLANEOUS) ×1 IMPLANT
ELECTRODE REM PT RTRN 9FT ADLT (ELECTROSURGICAL) ×1 IMPLANT
EVACUATOR 1/8 PVC DRAIN (DRAIN) IMPLANT
GAUZE 4X4 16PLY RFD (DISPOSABLE) ×1 IMPLANT
GAUZE SPONGE 4X4 12PLY STRL (GAUZE/BANDAGES/DRESSINGS) IMPLANT
GLOVE BIO SURGEON STRL SZ 6.5 (GLOVE) ×2 IMPLANT
GLOVE BIO SURGEON STRL SZ7 (GLOVE) ×2 IMPLANT
GLOVE BIO SURGEON STRL SZ8 (GLOVE) ×4 IMPLANT
GLOVE BIO SURGEON STRL SZ8.5 (GLOVE) ×4 IMPLANT
GLOVE BIOGEL PI IND STRL 6.5 (GLOVE) IMPLANT
GLOVE BIOGEL PI IND STRL 8 (GLOVE) IMPLANT
GLOVE BIOGEL PI INDICATOR 6.5 (GLOVE) ×1
GLOVE BIOGEL PI INDICATOR 8 (GLOVE) ×2
GLOVE ECLIPSE 7.5 STRL STRAW (GLOVE) ×3 IMPLANT
GLOVE EXAM NITRILE XL STR (GLOVE) IMPLANT
GOWN STRL REUS W/ TWL LRG LVL3 (GOWN DISPOSABLE) IMPLANT
GOWN STRL REUS W/ TWL XL LVL3 (GOWN DISPOSABLE) ×2 IMPLANT
GOWN STRL REUS W/TWL 2XL LVL3 (GOWN DISPOSABLE) ×1 IMPLANT
GOWN STRL REUS W/TWL LRG LVL3 (GOWN DISPOSABLE)
GOWN STRL REUS W/TWL XL LVL3 (GOWN DISPOSABLE) ×4
HEMOSTAT POWDER KIT SURGIFOAM (HEMOSTASIS) ×2 IMPLANT
KIT BASIN OR (CUSTOM PROCEDURE TRAY) ×2 IMPLANT
KIT TURNOVER KIT B (KITS) ×2 IMPLANT
MILL MEDIUM DISP (BLADE) ×1 IMPLANT
NDL HYPO 21X1.5 SAFETY (NEEDLE) IMPLANT
NEEDLE HYPO 21X1.5 SAFETY (NEEDLE) ×2 IMPLANT
NEEDLE HYPO 22GX1.5 SAFETY (NEEDLE) ×2 IMPLANT
NS IRRIG 1000ML POUR BTL (IV SOLUTION) ×2 IMPLANT
OIL CARTRIDGE MAESTRO DRILL (MISCELLANEOUS) ×2
PACK LAMINECTOMY NEURO (CUSTOM PROCEDURE TRAY) ×2 IMPLANT
PAD ARMBOARD 7.5X6 YLW CONV (MISCELLANEOUS) ×12 IMPLANT
PATTIES SURGICAL .5 X1 (DISPOSABLE) IMPLANT
PUTTY DBM 10CC CALC GRAN (Putty) ×1 IMPLANT
ROD PREBENT 6.35X70 (Rod) ×2 IMPLANT
SCREW PEDICLE VA L635 7.5X50M (Screw) ×2 IMPLANT
SCREW SET BREAK OFF (Screw) ×6 IMPLANT
SPACER ALTERA 10X26 9-13MM (Spacer) ×1 IMPLANT
SPONGE LAP 4X18 RFD (DISPOSABLE) IMPLANT
SPONGE NEURO XRAY DETECT 1X3 (DISPOSABLE) IMPLANT
SPONGE SURGIFOAM ABS GEL 100 (HEMOSTASIS) IMPLANT
STRIP CLOSURE SKIN 1/2X4 (GAUZE/BANDAGES/DRESSINGS) ×2 IMPLANT
SUT VIC AB 1 CT1 18XBRD ANBCTR (SUTURE) ×2 IMPLANT
SUT VIC AB 1 CT1 8-18 (SUTURE) ×4
SUT VIC AB 2-0 CP2 18 (SUTURE) ×4 IMPLANT
SYR 20ML LL LF (SYRINGE) ×1 IMPLANT
TOWEL GREEN STERILE (TOWEL DISPOSABLE) ×2 IMPLANT
TOWEL GREEN STERILE FF (TOWEL DISPOSABLE) ×2 IMPLANT
TRAY FOLEY MTR SLVR 16FR STAT (SET/KITS/TRAYS/PACK) ×2 IMPLANT
WATER STERILE IRR 1000ML POUR (IV SOLUTION) ×2 IMPLANT

## 2019-07-16 NOTE — H&P (Signed)
Subjective: The patient is a 48 year old white male on whom I performed an L4-5 decompression, instrumentation and fusion many years ago.  He has developed persistent and worsening back and leg pain.  He was worked up with a lumbar myelo CT which demonstrated L3-4 facet arthropathy, spinal stenosis, etc.  I discussed the various treatment options with him.  He has decided to proceed with surgery.  Past Medical History:  Diagnosis Date  . Anxiety   . Arthritis   . Diverticulitis   . GERD (gastroesophageal reflux disease)   . Hypertension     Past Surgical History:  Procedure Laterality Date  . BACK SURGERY    . CHONDROPLASTY Right 04/29/2015   Procedure: CHONDROPLASTY;  Surgeon: Ninetta Lights, MD;  Location: [place];  Service: Orthopedics;  Laterality: Right;  . KNEE ARTHROSCOPY WITH EXCISION PLICA Right 10/20/5425   Procedure: RIGHT KNEE ARTHROSCOPY, EXCISION OF PLICA, CHONDROPLASTY, PARTIAL MEDIAL MENISECTOMY AND PARTIAL LATERAL MENISECTOMY;  Surgeon: Ninetta Lights, MD;  Location: [place];  Service: Orthopedics;  Laterality: Right;  . KNEE ARTHROSCOPY WITH LATERAL MENISECTOMY Right 04/29/2015   Procedure: KNEE ARTHROSCOPY WITH LATERAL MENISECTOMY;  Surgeon: Ninetta Lights, MD;  Location: [place];  Service: Orthopedics;  Laterality: Right;  . KNEE ARTHROSCOPY WITH MEDIAL MENISECTOMY Right 04/29/2015   Procedure: KNEE ARTHROSCOPY WITH MEDIAL MENISECTOMY;  Surgeon: Ninetta Lights, MD;  Location: [place];  Service: Orthopedics;  Laterality: Right;  . KNEE SURGERY    . NASAL SEPTUM SURGERY  2004  . TONSILLECTOMY     as a child    No Known Allergies  Social History   Tobacco Use  . Smoking status: Former Smoker    Types: Cigarettes    Quit date: 2005    Years since quitting: 15.8  . Smokeless tobacco: Never Used  Substance Use Topics  . Alcohol use: No    No family history on file. Prior to Admission  medications   Medication Sig Start Date End Date Taking? Authorizing Provider  Cholecalciferol (VITAMIN D) 50 MCG (2000 UT) tablet Take 2,000 Units by mouth daily.    Yes [provider]  escitalopram (LEXAPRO) 10 MG tablet Take 10 mg by mouth daily.     Yes [provider]  esomeprazole (NEXIUM) 40 MG capsule Take 40 mg by mouth daily before breakfast.     Yes [provider]  gabapentin (NEURONTIN) 100 MG capsule Take 300 mg by mouth at bedtime.   Yes [provider]  hydrALAZINE (APRESOLINE) 50 MG tablet Take 50 mg by mouth 2 (two) times daily.   Yes [provider]  HYDROcodone-acetaminophen (NORCO) 10-325 MG per tablet Take 1-1.5 tablets by mouth every 4 (four) hours as needed for moderate pain.  04/02/15  Yes [provider]  metoprolol (TOPROL-XL) 100 MG 24 hr tablet Take 100 mg by mouth at bedtime.    Yes [provider]  Multiple Minerals-Vitamins (CALCIUM-MAGNESIUM-ZINC-D3 PO) Take 1 tablet by mouth daily.   Yes [provider]     Review of Systems  Positive ROS: As above  All other systems have been reviewed and were otherwise negative with the exception of those mentioned in the HPI and as above.  Objective: Vital signs in last 24 hours: Temp:  [98.5 F (36.9 C)] 98.5 F (36.9 C) (10/28 0616) Pulse Rate:  [79] 79 (10/28 0616) BP: (158)/(83) 158/83 (10/28 0616) SpO2:  [97 %] 97 % (10/28 0616) Estimated  body mass index is 32.61 kg/m as calculated from the following:   Height as of 07/08/19: 5\' 10"  (1.778 m).   Weight as of 07/08/19: 103.1 kg.   General Appearance: Alert Head: Normocephalic, without obvious abnormality, atraumatic Eyes: PERRL, conjunctiva/corneas clear, EOM's intact,    Ears: Normal  Throat: Normal  Neck: Supple, Back: The patient's lumbar incision is well-healed. Lungs: Clear to auscultation bilaterally, respirations unlabored Heart: Regular rate and rhythm, no murmur, rub or  gallop Abdomen: Soft, non-tender Extremities: Extremities normal, atraumatic, no cyanosis or edema Skin: unremarkable  NEUROLOGIC:   Mental status: alert and oriented,Motor Exam - grossly normal Sensory Exam - grossly normal Reflexes:  Coordination - grossly normal Gait - grossly normal Balance - grossly normal Cranial Nerves: I: smell Not tested  II: visual acuity  OS: Normal  OD: Normal   II: visual fields Full to confrontation  II: pupils Equal, round, reactive to light  III,VII: ptosis None  III,IV,VI: extraocular muscles  Full ROM  V: mastication Normal  V: facial light touch sensation  Normal  V,VII: corneal reflex  Present  VII: facial muscle function - upper  Normal  VII: facial muscle function - lower Normal  VIII: hearing Not tested  IX: soft palate elevation  Normal  IX,X: gag reflex Present  XI: trapezius strength  5/5  XI: sternocleidomastoid strength 5/5  XI: neck flexion strength  5/5  XII: tongue strength  Normal    Data Review Lab Results  Component Value Date   WBC 5.9 07/08/2019   HGB 14.9 07/08/2019   HCT 42.6 07/08/2019   MCV 95.1 07/08/2019   PLT 275 07/08/2019   Lab Results  Component Value Date   NA 141 07/08/2019   K 4.6 07/08/2019   CL 105 07/08/2019   CO2 27 07/08/2019   BUN 10 07/08/2019   CREATININE 0.94 07/08/2019   GLUCOSE 126 (H) 07/08/2019   No results found for: INR, PROTIME  Assessment/Plan: L3-4 facet arthropathy, spinal stenosis, lumbago, lumbar radiculopathy: I have discussed the situation with the patient.  I have reviewed his imaging studies with him and pointed out the abnormalities.  We have discussed the various treatment options including surgery.  I have described the surgical treatment option of an exploration of his lumbar fusion with an L3-4 decompression, instrumentation and fusion.  I have shown him surgical models.  I have given him a surgical pamphlet.  We have discussed the risks, benefits, alternatives,  expected postoperative course, and likelihood of achieving our goals with surgery.  I have answered all his questions.  He has decided to proceed with surgery.   07/10/2019 07/16/2019 7:16 AM

## 2019-07-16 NOTE — Op Note (Addendum)
Brief history: The patient is a 48 year old white male on whom I previously performed an L4-5 decompression, instrumentation and fusion.  He has developed recurrent back and left leg pain.  He has failed medical management.  He was worked up with lumbar x-rays, lumbar MRIs and a lumbar myelo CT which demonstrated spinal stenosis at L3-4.  I discussed the various treatment options with him.  He has decided to proceed with surgery after weighing the risks, benefits and alternatives.  Preoperative diagnosis: L3-4 degenerative disc disease, spinal stenosis compressing both the L3 and the L4 nerve roots; lumbago; lumbar radiculopathy; neurogenic claudication  Postoperative diagnosis: The same  Procedure: Bilateral L3-4 laminotomy/foraminotomies/medial facetectomy to decompress the bilateral L3 and L4 nerve roots(the work required to do this was in addition to the work required to do the posterior lumbar interbody fusion because of the patient's spinal stenosis, facet arthropathy. Etc. requiring a wide decompression of the nerve roots.);  L3-4 transforaminal lumbar interbody fusion with local morselized autograft bone and Zimmer DBM; insertion of interbody prosthesis at L3-4 (globus peek expandable interbody prosthesis); posterior segmental instrumentation from L3 to L5 with [employer] titanium pedicle screws and rods; posterior lateral arthrodesis at L3-4 with local morselized autograft bone and Zimmer DBM; exploration of lumbar fusion.  Surgeon: Dr. Earle Gell  Asst.: Arnetha Massy nurse practitioner  Anesthesia: Gen. endotracheal  Estimated blood loss: 200 cc  Drains: None  Complications: None  Description of procedure: The patient was brought to the operating room by the anesthesia team. General endotracheal anesthesia was induced. The patient was turned to the prone position on the Wilson frame. The patient's lumbosacral region was then prepared with Betadine scrub and Betadine solution. Sterile  drapes were applied.  I then injected the area to be incised with Marcaine with epinephrine solution. I then used the scalpel to make a linear midline incision over the L3-4 and L4-5 interspace. I then used electrocautery to perform a bilateral subperiosteal dissection exposing the spinous process and lamina of L3, L4 and L5 as well as exposing the old hardware at L4-5 bilaterally. We then inserted the Verstrac retractor to provide exposure.  We explored the fusion by removing the caps from the old screws and removing the rods.  I inspected the arthrodesis at L4-5.  It appeared solid.  I began the decompression by using the high speed drill to perform laminotomies at L3-4 bilaterally. We then used the Kerrison punches to widen the laminotomy and removed the ligamentum flavum at L3-4 bilaterally. We used the Kerrison punches to remove the medial facets at L3-4 bilaterally, we performed a facetectomy at L3-4 on the left. We performed wide foraminotomies about the bilateral L3 and L4 nerve roots completing the decompression.  We now turned our attention to the posterior lumbar interbody fusion. I used a scalpel to incise the intervertebral disc at L3-4 bilaterally. I then performed a partial intervertebral discectomy at L3-4 bilaterally using the pituitary forceps. We prepared the vertebral endplates at P8-0 bilaterally for the fusion by removing the soft tissues with the curettes. We then used the trial spacers to pick the appropriate sized interbody prosthesis. We prefilled his prosthesis with a combination of local morselized autograft bone that we obtained during the decompression as well as Zimmer DBM. We inserted the prefilled prosthesis into the interspace at L3-4 from the left, we then turned and expanded the prosthesis.  During the discectomy and insertion of the prosthesis, the assistant held the thecal sac and nerve roots out of harm's way  with the nerve root retractor.  There was a good snug fit of  the prosthesis in the interspace. We then filled and the remainder of the intervertebral disc space with local morselized autograft bone and Zimmer DBM. This completed the posterior lumbar interbody arthrodesis.  We now turned attention to the instrumentation. Under fluoroscopic guidance we cannulated the bilateral L3 pedicles with the bone probe. We then removed the bone probe. We then tapped the pedicle with a 6.5 millimeter tap. We then removed the tap. We probed inside the tapped pedicle with a ball probe to rule out cortical breaches. We then inserted a 7.5 x 50 millimeter pedicle screw into the L3 pedicles bilaterally under fluoroscopic guidance. We then palpated along the medial aspect of the pedicles to rule out cortical breaches. There were none. The nerve roots were not injured. We then connected the unilateral pedicle screws from L3-L5 bilaterally with a lordotic rod. We compressed the construct and secured the rod in place with the caps. We then tightened the caps appropriately. This completed the instrumentation from L3-L5 bilaterally.  We now turned our attention to the posterior lateral arthrodesis at L3-4 . We used the high-speed drill to decorticate the remainder of the facets, pars, transverse process at L3-4. We then applied a combination of local morselized autograft bone and Zimmer DBM over these decorticated posterior lateral structures. This completed the posterior lateral arthrodesis.  We then obtained hemostasis using bipolar electrocautery. We irrigated the wound out with bacitracin solution. We inspected the thecal sac and nerve roots and noted they were well decompressed. We then removed the retractor.  We injected Exparel . We reapproximated patient's thoracolumbar fascia with interrupted #1 Vicryl suture. We reapproximated patient's subcutaneous tissue with interrupted 2-0 Vicryl suture. The reapproximated patient's skin with Steri-Strips and benzoin. The wound was then coated  with bacitracin ointment. A sterile dressing was applied. The drapes were removed. The patient was subsequently returned to the supine position where they were extubated by the anesthesia team. He was then transported to the post anesthesia care unit in stable condition. All sponge instrument and needle counts were reportedly correct at the end of this case.

## 2019-07-16 NOTE — Transfer of Care (Signed)
Immediate Anesthesia Transfer of Care Note  Patient: Kenneth [name]  Procedure(s) Performed: POSTERIOR LUMBAR INTERBODY FUSION, POSTERIOR INSTRUMENTATION LUMBAR THREE- LUMBAR FOUR; EXPLORE FUSION (N/A Spine Lumbar)  Patient Location: PACU  Anesthesia Type:General  Level of Consciousness: awake, alert  and oriented  Airway & Oxygen Therapy: Patient Spontanous Breathing and Patient connected to face mask oxygen  Post-op Assessment: Report given to RN, Post -op Vital signs reviewed and stable and Patient moving all extremities X 4  Post vital signs: Reviewed and stable  Last Vitals:  Vitals Value Taken Time  BP 88/60 07/16/19 1136  Temp    Pulse 84 07/16/19 1145  Resp 15 07/16/19 1146  SpO2 95 % 07/16/19 1145  Vitals shown include unvalidated device data.  Last Pain:  Vitals:   07/16/19 0625  PainSc: 6       Patients Stated Pain Goal: 2 (68/11/57 2620)  Complications: No apparent anesthesia complications

## 2019-07-16 NOTE — Progress Notes (Signed)
Subjective: The patient is alert and pleasant.  His back is appropriately sore.  He looks well.  His left leg pain is gone.  Objective: Vital signs in last 24 hours: Temp:  [98.5 F (36.9 C)] 98.5 F (36.9 C) (10/28 1145) Pulse Rate:  [79-84] 84 (10/28 1245) Resp:  [15-20] 20 (10/28 1245) BP: (88-158)/(60-83) 101/67 (10/28 1215) SpO2:  [92 %-97 %] 94 % (10/28 1245) Estimated body mass index is 32.61 kg/m as calculated from the following:   Height as of 07/08/19: 5\' 10"  (1.778 m).   Weight as of 07/08/19: 103.1 kg.   Intake/Output from previous day: No intake/output data recorded. Intake/Output this shift: Total I/O In: 1400 [I.V.:1400] Out: 550 [Urine:400; Blood:150]  Physical exam the patient is alert and pleasant.  He is moving his lower extremities well.  Lab Results: No results for input(s): WBC, HGB, HCT, PLT in the last 72 hours. BMET No results for input(s): NA, K, CL, CO2, GLUCOSE, BUN, CREATININE, CALCIUM in the last 72 hours.  Studies/Results: Dg Lumbar Spine 2-3 Views  Result Date: 07/16/2019 CLINICAL DATA:  PLIF L3-4. EXAM: LUMBAR SPINE - 2-3 VIEW; DG C-ARM 1-60 MIN COMPARISON:  03/28/2019 lumbar spine CT FLUOROSCOPY TIME:  Fluoroscopy Time:  0 minutes 17 seconds Number of Acquired Spot Images: 2 FINDINGS: Nondiagnostic spot fluoroscopic intraoperative lumbar spine radiographs demonstrate bilateral pedicle screws at L3, L4 and L5 with new bone cage in the L3-4 disc space and previous bone cage in the L4-5 disc space. IMPRESSION: Intraoperative fluoroscopic guidance for PLIF L3-4. Electronically Signed   By: Ilona Sorrel M.D.   On: 07/16/2019 11:11   Dg C-arm 1-60 Min  Result Date: 07/16/2019 CLINICAL DATA:  PLIF L3-4. EXAM: LUMBAR SPINE - 2-3 VIEW; DG C-ARM 1-60 MIN COMPARISON:  03/28/2019 lumbar spine CT FLUOROSCOPY TIME:  Fluoroscopy Time:  0 minutes 17 seconds Number of Acquired Spot Images: 2 FINDINGS: Nondiagnostic spot fluoroscopic intraoperative lumbar  spine radiographs demonstrate bilateral pedicle screws at L3, L4 and L5 with new bone cage in the L3-4 disc space and previous bone cage in the L4-5 disc space. IMPRESSION: Intraoperative fluoroscopic guidance for PLIF L3-4. Electronically Signed   By: Ilona Sorrel M.D.   On: 07/16/2019 11:11    Assessment/Plan: The patient is doing well.  I spoke with his wife.  LOS: 0 days     Ophelia Charter 07/16/2019, 2:00 PM

## 2019-07-16 NOTE — Evaluation (Signed)
Physical Therapy Evaluation Patient Details Name: Kenneth [name] MRN: [id number] DOB: Nov 10, 1970 Today's Date: 07/16/2019   History of Present Illness  pt is a 48 y/o male with pmh of back surgery, multiple knee arthroscopies R knee and HTN, admitted with worsening back and leg pain.  pt s/p bil L34 decompression with laminotomy/foraminotomies and facetectomy plus transforaminal lumbar interbody fusion with autograft and PLA.  Clinical Impression  Pt admitted with/for lumbar fusion surgery.  Presently needs min guard or less for mobility.  Pt currently limited functionally due to the problems listed below.  (see problems list.)  Pt will benefit from PT to maximize function and safety to be able to get home safely with available assist .     Follow Up Recommendations No PT follow up    Equipment Recommendations  None recommended by PT    Recommendations for Other Services       Precautions / Restrictions Precautions Precautions: Back Precaution Booklet Issued: No Required Braces or Orthoses: Spinal Brace Spinal Brace: Lumbar corset;Applied in sitting position Restrictions Weight Bearing Restrictions: No      Mobility  Bed Mobility Overal bed mobility: Needs Assistance Bed Mobility: Sidelying to Sit;Sit to Sidelying;Rolling Rolling: Min guard Sidelying to sit: Min guard     Sit to sidelying: Min guard General bed mobility comments: cues for technique, rail used, no assist  Transfers Overall transfer level: Needs assistance   Transfers: Sit to/from Stand Sit to Stand: Min guard            Ambulation/Gait Ambulation/Gait assistance: Min guard Gait Distance (Feet): 300 Feet Assistive device: None Gait Pattern/deviations: Step-through pattern Gait velocity: slower   General Gait Details: steady gait, mild incisional and left hip discomfort.  No overt radiation.  Stairs Stairs: Yes Stairs assistance: Min guard Stair Management: One rail Left;Alternating  pattern;Step to pattern;Forwards Number of Stairs: 10 General stair comments: safe with rail, smooth descent  Wheelchair Mobility    Modified Rankin (Stroke Patients Only)       Balance Overall balance assessment: No apparent balance deficits (not formally assessed)                                           Pertinent Vitals/Pain Pain Assessment: Faces Faces Pain Scale: Hurts little more Pain Location: incisional, L hip Pain Descriptors / Indicators: Grimacing;Guarding;Discomfort Pain Intervention(s): Monitored during session    Home Living Family/patient expects to be discharged to:: Private residence Living Arrangements: Spouse/significant other Available Help at Discharge: Family;Available 24 hours/day;Other (Comment)(24* as needed) Type of Home: House Home Access: Stairs to enter Entrance Stairs-Rails: Right;Left Entrance Stairs-Number of Steps: 2 Home Layout: Two level;Able to live on main level with bedroom/bathroom Home Equipment: Gilford Rile - 2 wheels;Cane - single point;Toilet riser;Shower seat      Prior Function Level of Independence: Independent(occasional cane)               Hand Dominance        Extremity/Trunk Assessment   Upper Extremity Assessment Upper Extremity Assessment: Overall WFL for tasks assessed    Lower Extremity Assessment Lower Extremity Assessment: Overall WFL for tasks assessed       Communication   Communication: No difficulties  Cognition Arousal/Alertness: Awake/alert Behavior During Therapy: WFL for tasks assessed/performed Overall Cognitive Status: Within Functional Limits for tasks assessed  General Comments General comments (skin integrity, edema, etc.): pt instructed in back care/prec, log roll, lifting restrictions, bracing issues, progression of activity    Exercises     Assessment/Plan    PT Assessment Patient needs continued PT  services  PT Problem List Decreased activity tolerance;Decreased mobility;Decreased knowledge of precautions;Pain;Decreased knowledge of use of DME       PT Treatment Interventions Gait training;DME instruction;Functional mobility training;Therapeutic activities;Patient/family education;Stair training    PT Goals (Current goals can be found in the Care Plan section)  Acute Rehab PT Goals Patient Stated Goal: able to be active PT Goal Formulation: With patient Time For Goal Achievement: 07/19/19 Potential to Achieve Goals: Good    Frequency Min 5X/week   Barriers to discharge        Co-evaluation               AM-PAC PT "6 Clicks" Mobility  Outcome Measure Help needed turning from your back to your side while in a flat bed without using bedrails?: A Little Help needed moving from lying on your back to sitting on the side of a flat bed without using bedrails?: A Little Help needed moving to and from a bed to a chair (including a wheelchair)?: A Little Help needed standing up from a chair using your arms (e.g., wheelchair or bedside chair)?: A Little Help needed to walk in hospital room?: A Little Help needed climbing 3-5 steps with a railing? : A Little 6 Click Score: 18    End of Session Equipment Utilized During Treatment: Back brace Activity Tolerance: Patient tolerated treatment well Patient left: in bed;with call bell/phone within reach Nurse Communication: Mobility status PT Visit Diagnosis: Other abnormalities of gait and mobility (R26.89);Pain Pain - part of body: (back incision)    Time: 8101-7510 PT Time Calculation (min) (ACUTE ONLY): 26 min   Charges:   PT Evaluation $PT Eval Low Complexity: 1 Low PT Treatments $Gait Training: 8-22 mins        07/16/2019  [place] Bing, PT Acute Rehabilitation Services [phone]  (pager) [phone]  (office)  Kenneth [name] 07/16/2019, 5:03 PM

## 2019-07-16 NOTE — Anesthesia Preprocedure Evaluation (Addendum)
Anesthesia Evaluation  Patient identified by MRN, date of birth, ID band Patient awake    Reviewed: Allergy & Precautions, NPO status , Patient's Chart, lab work & pertinent test results  Airway Mallampati: II  TM Distance: >3 FB     Dental   Pulmonary former smoker,    breath sounds clear to auscultation       Cardiovascular hypertension,  Rhythm:Regular Rate:Normal     Neuro/Psych    GI/Hepatic Neg liver ROS, GERD  ,  Endo/Other  negative endocrine ROS  Renal/GU negative Renal ROS     Musculoskeletal   Abdominal   Peds  Hematology   Anesthesia Other Findings   Reproductive/Obstetrics                            Anesthesia Physical Anesthesia Plan  ASA: III  Anesthesia Plan: General   Post-op Pain Management:    Induction: Intravenous  PONV Risk Score and Plan: 2 and Ondansetron, Dexamethasone and Midazolam  Airway Management Planned: Oral ETT  Additional Equipment:   Intra-op Plan:   Post-operative Plan: Possible Post-op intubation/ventilation  Informed Consent: I have reviewed the patients History and Physical, chart, labs and discussed the procedure including the risks, benefits and alternatives for the proposed anesthesia with the patient or authorized representative who has indicated his/her understanding and acceptance.       Plan Discussed with: CRNA and Anesthesiologist  Anesthesia Plan Comments:        Anesthesia Quick Evaluation

## 2019-07-16 NOTE — Anesthesia Procedure Notes (Signed)
Procedure Name: Intubation Date/Time: 07/16/2019 7:31 AM Performed by: Valda Favia, CRNA Pre-anesthesia Checklist: Patient identified, Emergency Drugs available, Suction available and Patient being monitored Patient Re-evaluated:Patient Re-evaluated prior to induction Oxygen Delivery Method: Circle System Utilized Preoxygenation: Pre-oxygenation with 100% oxygen Induction Type: IV induction and Rapid sequence Laryngoscope Size: Mac and 4 Grade View: Grade II Tube type: Oral Tube size: 7.5 mm Number of attempts: 1 Airway Equipment and Method: Stylet and Oral airway Placement Confirmation: ETT inserted through vocal cords under direct vision,  positive ETCO2 and breath sounds checked- equal and bilateral Secured at: 22 cm Tube secured with: Tape Dental Injury: Teeth and Oropharynx as per pre-operative assessment

## 2019-07-16 NOTE — Anesthesia Postprocedure Evaluation (Signed)
Anesthesia Post Note  Patient: Kenneth [name]  Procedure(s) Performed: POSTERIOR LUMBAR INTERBODY FUSION, POSTERIOR INSTRUMENTATION LUMBAR THREE- LUMBAR FOUR; EXPLORE FUSION (N/A Spine Lumbar)     Anesthesia Post Evaluation  Last Vitals:  Vitals:   07/16/19 1430 07/16/19 1448  BP:  95/68  Pulse: 93 91  Resp: (!) 27 17  Temp: (!) 36.2 C 36.9 C  SpO2: 97% 92%    Last Pain:  Vitals:   07/16/19 1448  TempSrc: Oral  PainSc:                  CHARLENE GREEN

## 2019-07-17 LAB — CBC
HCT: 37.1 % — ABNORMAL LOW (ref 39.0–52.0)
Hemoglobin: 12.4 g/dL — ABNORMAL LOW (ref 13.0–17.0)
MCH: 32 pg (ref 26.0–34.0)
MCHC: 33.4 g/dL (ref 30.0–36.0)
MCV: 95.6 fL (ref 80.0–100.0)
Platelets: 229 10*3/uL (ref 150–400)
RBC: 3.88 MIL/uL — ABNORMAL LOW (ref 4.22–5.81)
RDW: 12.2 % (ref 11.5–15.5)
WBC: 13.1 10*3/uL — ABNORMAL HIGH (ref 4.0–10.5)
nRBC: 0 % (ref 0.0–0.2)

## 2019-07-17 LAB — BASIC METABOLIC PANEL
Anion gap: 9 (ref 5–15)
BUN: 10 mg/dL (ref 6–20)
CO2: 25 mmol/L (ref 22–32)
Calcium: 9 mg/dL (ref 8.9–10.3)
Chloride: 105 mmol/L (ref 98–111)
Creatinine, Ser: 1.03 mg/dL (ref 0.61–1.24)
GFR calc Af Amer: >60 mL/min (ref >60)
GFR calc non Af Amer: >60 mL/min (ref >60)
Glucose, Bld: 136 mg/dL — ABNORMAL HIGH (ref 70–99)
Potassium: 4.2 mmol/L (ref 3.5–5.1)
Sodium: 139 mmol/L (ref 135–145)

## 2019-07-17 MED ORDER — OXYCODONE HCL 10 MG PO TABS: 10.0000 mg | ORAL_TABLET | ORAL | 0 refills | Status: AC | PRN

## 2019-07-17 MED ORDER — METHOCARBAMOL 750 MG PO TABS
750.0000 mg | ORAL_TABLET | Freq: Once | ORAL | Status: AC
  Administered 2019-07-17: 750 mg via ORAL
  Filled 2019-07-17: qty 1

## 2019-07-17 MED ORDER — CYCLOBENZAPRINE HCL 10 MG PO TABS: 10.0000 mg | ORAL_TABLET | Freq: Three times a day (TID) | ORAL | 0 refills | Status: AC | PRN

## 2019-07-17 MED ORDER — DOCUSATE SODIUM 100 MG PO CAPS: 100.0000 mg | ORAL_CAPSULE | Freq: Two times a day (BID) | ORAL | 0 refills | Status: AC

## 2019-07-17 MED FILL — Heparin Sodium (Porcine) Inj 1000 Unit/ML: INTRAMUSCULAR | Qty: 30 | Status: AC

## 2019-07-17 MED FILL — Sodium Chloride IV Soln 0.9%: INTRAVENOUS | Qty: 1000 | Status: AC

## 2019-07-17 NOTE — Evaluation (Addendum)
Occupational Therapy Evaluation Patient Details Name: Kenneth [name] MRN: [id number] DOB: Jan 27, 1971 Today's Date: 07/17/2019    History of Present Illness pt is a 48 y/o male with pmh of back surgery, multiple knee arthroscopies R knee and HTN, admitted with worsening back and leg pain.  pt s/p bil L34 decompression with laminotomy/foraminotomies and facetectomy plus transforaminal lumbar interbody fusion with autograft and PLA.   Clinical Impression   Patient evaluated by Occupational Therapy with no further acute OT needs identified. All education has been completed and the patient has no further questions. Pt demonstrates good awareness of precautions and safety and is able to perform ADLs mod I. See below for any follow-up Occupational Therapy or equipment needs. OT is signing off. Thank you for this referral.      Follow Up Recommendations  No OT follow up;Supervision - Intermittent    Equipment Recommendations  None recommended by OT    Recommendations for Other Services       Precautions / Restrictions Precautions Precautions: Back Precaution Booklet Issued: No Required Braces or Orthoses: Spinal Brace Spinal Brace: Lumbar corset;Applied in sitting position Restrictions Weight Bearing Restrictions: No      Mobility Bed Mobility Overal bed mobility: Modified Independent             General bed mobility comments: Pt ambulating in hallway.  Pt reports he has been performing bed mobility mod I - reviewed safety and proper technique   Transfers Overall transfer level: Modified independent                    Balance Overall balance assessment: No apparent balance deficits (not formally assessed)                                         ADL either performed or assessed with clinical judgement   ADL Overall ADL's : Modified independent                                       General ADL Comments: Pt is able to perform  figure 4, and reports this is the method he has been using for LB ADLs for some time.  Reviewed safety with IADLs, and maintaining precautions with him.  Reviewed safety with grooming/oral care/shaving.  He demonstrates good awareness of precautions and safety      Vision         Perception     Praxis      Pertinent Vitals/Pain Pain Assessment: 0-10 Pain Score: 7  Pain Location: incisional, L hip Pain Descriptors / Indicators: Grimacing;Guarding;Discomfort Pain Intervention(s): Monitored during session     Hand Dominance     Extremity/Trunk Assessment Upper Extremity Assessment Upper Extremity Assessment: Overall WFL for tasks assessed   Lower Extremity Assessment Lower Extremity Assessment: Overall WFL for tasks assessed   Cervical / Trunk Assessment Cervical / Trunk Assessment: Normal   Communication Communication Communication: No difficulties   Cognition Arousal/Alertness: Awake/alert Behavior During Therapy: WFL for tasks assessed/performed Overall Cognitive Status: Within Functional Limits for tasks assessed                                     General Comments  Pt demonstrates good awareness of  precautions and safety.  Reviewed safe technique for vehicle transfer     Exercises     Shoulder Instructions      Home Living Family/patient expects to be discharged to:: Private residence Living Arrangements: Spouse/significant other Available Help at Discharge: Family;Available 24 hours/day;Other (Comment)(24* as needed) Type of Home: House Home Access: Stairs to enter [employer] of Steps: 2 Entrance Stairs-Rails: Right;Left Home Layout: Two level;Able to live on main level with bedroom/bathroom Alternate Level Stairs-Number of Steps: 12 Alternate Level Stairs-Rails: Right;Left Bathroom Shower/Tub: Producer, television/film/video: Handicapped height     Home Equipment: Environmental consultant - 2 wheels;Cane - single point;Toilet riser;Shower  seat   Additional Comments: Pt has good support at home       Prior Functioning/Environment Level of Independence: Independent(occasional cane)        Comments: Pt reports he required assist wtih IADLs.  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, community Problem List: Decreased activity tolerance;Pain      OT Treatment/Interventions:      OT Goals(Current goals can be found in the care plan section) Acute Rehab OT Goals Patient Stated Goal: to get rid of leg/hip pain  OT Goal Formulation: All assessment and education complete, DC therapy  OT Frequency:     Barriers to D/C:            Co-evaluation              AM-PAC OT "6 Clicks" Daily Activity     Outcome Measure Help from another person eating meals?: None Help from another person taking care of personal grooming?: None Help from another person toileting, which includes using toliet, bedpan, or urinal?: None Help from another person bathing (including washing, rinsing, drying)?: None Help from another person to put on and taking off regular upper body clothing?: None Help from another person to put on and taking off regular lower body clothing?: None 6 Click Score: 24   End of Session Equipment Utilized During Treatment: Back brace Nurse Communication: Mobility status  Activity Tolerance: Patient tolerated treatment well Patient left: in bed;with call bell/phone within reach  OT Visit Diagnosis: Pain Pain - Right/Left: Left Pain - part of body: Hip                Time:  -    07/17/19 1000  OT Time Calculation  OT Start Time (ACUTE ONLY) 0901  OT Stop Time (ACUTE ONLY) 0920  OT Time Calculation (min) 19 min  OT General Charges  $OT Visit 1 Visit  OT Evaluation  $OT Eval Low Complexity 1 Low     Charges:     Jeani Hawking, OTR/L Acute Rehabilitation Services Pager [phone] Office [phone]   Jeani Hawking M 07/17/2019, 11:31 AM

## 2019-07-17 NOTE — Progress Notes (Signed)
Patient alert and oriented, mae's well, voiding adequate amount of urine, swallowing without difficulty, no c/o pain at time of discharge. Patient discharged home with family. Script and discharged instructions given to patient. Patient and family stated understanding of instructions given. Patient has an appointment with Dr. Arnoldo Morale in three weeks.

## 2019-07-17 NOTE — Discharge Summary (Signed)
Physician Discharge Summary  Patient ID: Kenneth [name] MRN: [id number] DOB/AGE: [birth year] 48 y.o.  Admit date: 07/16/2019 Discharge date: 07/17/2019  Admission Diagnoses: Lumbar degenerative disease, lumbar spinal stenosis, lumbago, lumbar radiculopathy  Discharge Diagnoses: The same Active Problems:   Degenerative lumbar spinal stenosis   Discharged Condition: good  Hospital Course: I performed an exploration of the patient's lumbar fusion with an L3-4 decompression, instrumentation and fusion on 07/16/2019.  The surgery went well.  The patient's postoperative course was unremarkable.  On postoperative day #1 he requested discharge to home.  He was given written and oral discharge instructions.  All his questions were answered.  Consults: PT, OT, care management Significant Diagnostic Studies: None Treatments: Exploration of lumbar fusion, L3-4 decompression, instrumentation and fusion. Discharge Exam: Blood pressure (!) 108/58, pulse 89, temperature 98.4 F (36.9 C), temperature source Oral, resp. rate 18, SpO2 91 %. The patient is alert and pleasant.  His lower extremity strength is grossly normal.  Disposition: Home  Discharge Instructions    Call MD for:  difficulty breathing, headache or visual disturbances   Complete by: As directed    Call MD for:  extreme fatigue   Complete by: As directed    Call MD for:  hives   Complete by: As directed    Call MD for:  persistant dizziness or light-headedness   Complete by: As directed    Call MD for:  persistant nausea and vomiting   Complete by: As directed    Call MD for:  redness, tenderness, or signs of infection (pain, swelling, redness, odor or green/yellow discharge around incision site)   Complete by: As directed    Call MD for:  severe uncontrolled pain   Complete by: As directed    Call MD for:  temperature >100.4   Complete by: As directed    Diet - low sodium heart healthy   Complete by: As directed    Discharge instructions   Complete by: As directed    Call [phone] for a followup appointment. Take a stool softener while you are using pain medications.   Driving Restrictions   Complete by: As directed    Do not drive for 2 weeks.   Increase activity slowly   Complete by: As directed    Lifting restrictions   Complete by: As directed    Do not lift more than 5 pounds. No excessive bending or twisting.   May shower / Bathe   Complete by: As directed    Remove the dressing for 3 days after surgery.  You may shower, but leave the incision alone.   Remove dressing in 48 hours   Complete by: As directed    Your stitches are under the scan and will dissolve by themselves. The Steri-Strips will fall off after you take a few showers. Do not rub back or pick at the wound, Leave the wound alone.     Allergies as of 07/17/2019   No Known Allergies     Medication List    STOP taking these medications   HYDROcodone-acetaminophen 10-325 MG tablet Commonly known as: NORCO     TAKE these medications   CALCIUM-MAGNESIUM-ZINC-D3 PO Take 1 tablet by mouth daily.   cyclobenzaprine 10 MG tablet Commonly known as: FLEXERIL Take 1 tablet (10 mg total) by mouth 3 (three) times daily as needed for muscle spasms.   docusate sodium 100 MG capsule Commonly known as: COLACE Take 1 capsule (100 mg total) by mouth 2 (two) times  daily.   escitalopram 10 MG tablet Commonly known as: LEXAPRO Take 10 mg by mouth daily.   esomeprazole 40 MG capsule Commonly known as: NEXIUM Take 40 mg by mouth daily before breakfast.   gabapentin 100 MG capsule Commonly known as: NEURONTIN Take 300 mg by mouth at bedtime.   hydrALAZINE 50 MG tablet Commonly known as: APRESOLINE Take 50 mg by mouth 2 (two) times daily.   metoprolol succinate 100 MG 24 hr tablet Commonly known as: TOPROL-XL Take 100 mg by mouth at bedtime.   Oxycodone HCl 10 MG Tabs Take 1 tablet (10 mg total) by mouth every 3 (three)  hours as needed for severe pain ((score 7 to 10)).   Vitamin D 50 MCG (2000 UT) tablet Take 2,000 Units by mouth daily.        Signed: Ophelia Charter 07/17/2019, 2:01 PM

## 2019-07-17 NOTE — Progress Notes (Signed)
PT Cancellation Note  Patient Details Name: Kenneth [name] MRN: [id number] DOB: 01/24/1971   Cancelled Treatment:    Reason Eval/Treat Not Completed: Observed pt ambulating in the hall at a modified independent to independent level without an AD. Pt reports he feels comfortable negotiating stairs as he practiced on eval with PT yesterday. OT to see today and complete any outstanding education. Will sign off at this time, however if needs change, please page me at [phone] and I will be happy to see patient for a formal session.    Thelma Comp 07/17/2019, 10:25 AM   Rolinda Roan, PT, DPT Acute Rehabilitation Services Pager: [phone] Office: [phone]

## 2019-12-05 ENCOUNTER — Encounter (HOSPITAL_COMMUNITY): Payer: Self-pay

## 2019-12-05 ENCOUNTER — Emergency Department (HOSPITAL_COMMUNITY)
Admission: EM | Admit: 2019-12-05 | Discharge: 2019-12-05 | Disposition: A | Discharge status: Home / Self Care | Payer: 59 | Attending: Emergency Medicine | Admitting: Emergency Medicine

## 2019-12-05 ENCOUNTER — Other Ambulatory Visit: Payer: Self-pay

## 2019-12-05 ENCOUNTER — Emergency Department (HOSPITAL_COMMUNITY): Payer: 59

## 2019-12-05 DIAGNOSIS — Y939 Activity, unspecified: Secondary | ICD-10-CM | POA: Diagnosis not present

## 2019-12-05 DIAGNOSIS — Y929 Unspecified place or not applicable: Secondary | ICD-10-CM | POA: Insufficient documentation

## 2019-12-05 DIAGNOSIS — Z79899 Other long term (current) drug therapy: Secondary | ICD-10-CM | POA: Insufficient documentation

## 2019-12-05 DIAGNOSIS — S3992XA Unspecified injury of lower back, initial encounter: Secondary | ICD-10-CM | POA: Diagnosis present

## 2019-12-05 DIAGNOSIS — W010XXA Fall on same level from slipping, tripping and stumbling without subsequent striking against object, initial encounter: Secondary | ICD-10-CM | POA: Insufficient documentation

## 2019-12-05 DIAGNOSIS — I10 Essential (primary) hypertension: Secondary | ICD-10-CM | POA: Insufficient documentation

## 2019-12-05 DIAGNOSIS — S39012A Strain of muscle, fascia and tendon of lower back, initial encounter: Secondary | ICD-10-CM | POA: Diagnosis not present

## 2019-12-05 DIAGNOSIS — S335XXA Sprain of ligaments of lumbar spine, initial encounter: Secondary | ICD-10-CM

## 2019-12-05 DIAGNOSIS — Y999 Unspecified external cause status: Secondary | ICD-10-CM | POA: Insufficient documentation

## 2019-12-05 DIAGNOSIS — Z87891 Personal history of nicotine dependence: Secondary | ICD-10-CM | POA: Insufficient documentation

## 2019-12-05 MED ORDER — OXYCODONE-ACETAMINOPHEN 5-325 MG PO TABS
1.0000 | ORAL_TABLET | Freq: Once | ORAL | Status: AC
  Administered 2019-12-05: 1 via ORAL
  Filled 2019-12-05: qty 1

## 2019-12-05 MED ORDER — HYDROMORPHONE HCL 1 MG/ML IJ SOLN
1.0000 mg | Freq: Once | INTRAMUSCULAR | Status: AC
  Administered 2019-12-05: 1 mg via INTRAVENOUS
  Filled 2019-12-05: qty 1

## 2019-12-05 MED ORDER — IBUPROFEN 800 MG PO TABS
800.0000 mg | ORAL_TABLET | Freq: Once | ORAL | Status: AC
  Administered 2019-12-05: 800 mg via ORAL
  Filled 2019-12-05: qty 1

## 2019-12-05 MED ORDER — HYDROMORPHONE HCL 2 MG/ML IJ SOLN: 2.0000 mg | Freq: Once | INTRAMUSCULAR | Status: DC

## 2019-12-05 MED ORDER — BACLOFEN 10 MG PO TABS: 10.0000 mg | ORAL_TABLET | Freq: Three times a day (TID) | ORAL | 0 refills | Status: AC

## 2019-12-05 MED ORDER — CELECOXIB 200 MG PO CAPS: 200.0000 mg | ORAL_CAPSULE | Freq: Two times a day (BID) | ORAL | 0 refills | Status: AC

## 2019-12-05 MED ORDER — CYCLOBENZAPRINE HCL 10 MG PO TABS
10.0000 mg | ORAL_TABLET | Freq: Once | ORAL | Status: AC
  Administered 2019-12-05: 10 mg via ORAL
  Filled 2019-12-05: qty 1

## 2019-12-05 NOTE — ED Provider Notes (Signed)
[facility] EMERGENCY DEPARTMENT Provider Note   CSN: [id number] Arrival date & time: 12/05/19  1306     History Chief Complaint  Patient presents with  . Back Pain    Kenneth [name] is a 49 y.o. male with a past medical history of chronic back pain status post 2 previous lumbar surgeries, followed by Dr. Lovell Sheehan who presents emergency department today after injury to the lumbar spine.  Patient states that he is clumsy and he lost his footing, fell backward, got caught by the edge of the bar top in his kitchen and hyperextended his lumbar spine.  He had immediate severe pain worse on the right.  He was unable to get off of the floor.  Patient states that he was very afraid that he might have broken something or disrupted his surgical sites.  He denies any numbness or tingling in the lower extremity, lower extremity weakness.  The pain is severe, located to the right of his lower back in the lumbar region.  It does not radiate.  He has no new loss of bowel or bladder continence.  He denies saddle anesthesia.  HPI     Past Medical History:  Diagnosis Date  . Anxiety   . Arthritis   . Diverticulitis   . GERD (gastroesophageal reflux disease)   . Hypertension     Patient Active Problem List   Diagnosis Date Noted  . Degenerative lumbar spinal stenosis 07/16/2019    Past Surgical History:  Procedure Laterality Date  . BACK SURGERY    . CHONDROPLASTY Right 04/29/2015   Procedure: CHONDROPLASTY;  Surgeon: [name], MD;  Location: [facility];  Service: Orthopedics;  Laterality: Right;  . KNEE ARTHROSCOPY WITH EXCISION PLICA Right 04/29/2015   Procedure: RIGHT KNEE ARTHROSCOPY, EXCISION OF PLICA, CHONDROPLASTY, PARTIAL MEDIAL MENISECTOMY AND PARTIAL LATERAL MENISECTOMY;  Surgeon: [name], MD;  Location: [facility];  Service: Orthopedics;  Laterality: Right;  . KNEE ARTHROSCOPY WITH LATERAL MENISECTOMY Right 04/29/2015   Procedure: KNEE  ARTHROSCOPY WITH LATERAL MENISECTOMY;  Surgeon: [name], MD;  Location: [facility];  Service: Orthopedics;  Laterality: Right;  . KNEE ARTHROSCOPY WITH MEDIAL MENISECTOMY Right 04/29/2015   Procedure: KNEE ARTHROSCOPY WITH MEDIAL MENISECTOMY;  Surgeon: [name], MD;  Location: [facility];  Service: Orthopedics;  Laterality: Right;  . KNEE SURGERY    . NASAL SEPTUM SURGERY  2004  . SPINAL FUSION    . TONSILLECTOMY     as a child       No family history on file.  Social History   Tobacco Use  . Smoking status: Former Smoker    Types: Cigarettes    Quit date: 2005    Years since quitting: 16.2  . Smokeless tobacco: Never Used  Substance Use Topics  . Alcohol use: No  . Drug use: No    Home Medications Prior to Admission medications   Medication Sig Start Date End Date Taking? Authorizing Provider  Cholecalciferol (VITAMIN D) 50 MCG (2000 UT) tablet Take 2,000 Units by mouth daily.     [provider]  cyclobenzaprine (FLEXERIL) 10 MG tablet Take 1 tablet (10 mg total) by mouth 3 (three) times daily as needed for muscle spasms. 07/17/19   Tressie Stalker, MD  docusate sodium (COLACE) 100 MG capsule Take 1 capsule (100 mg total) by mouth 2 (two) times daily. 07/17/19   Tressie Stalker, MD  escitalopram (LEXAPRO) 10 MG tablet  Take 10 mg by mouth daily.      [provider]  esomeprazole (NEXIUM) 40 MG capsule Take 40 mg by mouth daily before breakfast.      [provider]  gabapentin (NEURONTIN) 100 MG capsule Take 300 mg by mouth at bedtime.    [provider]  hydrALAZINE (APRESOLINE) 50 MG tablet Take 50 mg by mouth 2 (two) times daily.    [provider]  metoprolol (TOPROL-XL) 100 MG 24 hr tablet Take 100 mg by mouth at bedtime.     [provider]  Multiple Minerals-Vitamins (CALCIUM-MAGNESIUM-ZINC-D3 PO) Take 1 tablet by mouth daily.    [provider]  oxyCODONE 10 MG  TABS Take 1 tablet (10 mg total) by mouth every 3 (three) hours as needed for severe pain ((score 7 to 10)). 07/17/19   Newman Pies, MD    Allergies    Patient has no known allergies.  Review of Systems   Review of Systems Ten systems reviewed and are negative for acute change, except as noted in the HPI.   Physical Exam Updated Vital Signs BP (!) 153/101 (BP Location: Right Arm)   Pulse 74   Temp 98.1 F (36.7 C) (Oral)   Resp 18   Ht 5\' 10"  (1.778 m)   Wt 102.1 kg   SpO2 100%   BMI 32.28 kg/m   Physical Exam Vitals and nursing note reviewed.  Constitutional:      General: He is not in acute distress.    Appearance: He is well-developed. He is not diaphoretic.     Comments: Patient lying supine.  He appears very nervous and very uncomfortable.  HENT:     Head: Normocephalic and atraumatic.  Eyes:     General: No scleral icterus.    Conjunctiva/sclera: Conjunctivae normal.  Cardiovascular:     Rate and Rhythm: Normal rate and regular rhythm.     Heart sounds: Normal heart sounds.  Pulmonary:     Effort: Pulmonary effort is normal. No respiratory distress.     Breath sounds: Normal breath sounds.  Abdominal:     Palpations: Abdomen is soft.     Tenderness: There is no abdominal tenderness.  Musculoskeletal:     Cervical back: Normal range of motion and neck supple.     Comments: No midline tenderness, no obvious bruising.  Exquisitely tender to palpation in the right lumbar paraspinal region specifically in the quadratus lumborum muscle.  Minimal tenderness on the left side.  Normal strength in the bilateral lower extremities with plantar and dorsiflexion, 2+ patellar reflexes bilaterally, normal sensation, DP pulse 2+ in the left and right foot.  Skin:    General: Skin is warm and dry.  Neurological:     Mental Status: He is alert.  Psychiatric:        Behavior: Behavior normal.     ED Results / Procedures / Treatments   Labs (all labs ordered are listed,  but only abnormal results are displayed) Labs Reviewed - No data to display  EKG None  Radiology DG Thoracic Spine 2 View  Result Date: 12/05/2019 CLINICAL DATA:  Fall, back pain. History of spinal fusion EXAM: THORACIC SPINE 2 VIEWS; LUMBAR SPINE - COMPLETE 4+ VIEW COMPARISON:  11/21/2019 FINDINGS: Thoracic vertebral body heights are maintained without evidence of fracture. No static listhesis. Intervertebral disc spaces are maintained. Visualized portion of the lung fields are clear. Transitional lumbosacral anatomy. In keeping with prior reporting, the fused levels are designated as L3-L5.  Hardware is intact. No hardware fracture. No perihardware lucency or fracture. Vertebral body heights are maintained without fracture or static listhesis. Mild intervertebral disc height loss of L2-3. IMPRESSION: 1. No acute fracture or subluxation involving the thoracic or lumbar spine. 2. Intact L3-L5 PLIF hardware. 3. Transitional lumbosacral anatomy. Electronically Signed   By: Duanne Guess D.O.   On: 12/05/2019 15:14   DG Lumbar Spine Complete  Result Date: 12/05/2019 CLINICAL DATA:  Fall, back pain. History of spinal fusion EXAM: THORACIC SPINE 2 VIEWS; LUMBAR SPINE - COMPLETE 4+ VIEW COMPARISON:  11/21/2019 FINDINGS: Thoracic vertebral body heights are maintained without evidence of fracture. No static listhesis. Intervertebral disc spaces are maintained. Visualized portion of the lung fields are clear. Transitional lumbosacral anatomy. In keeping with prior reporting, the fused levels are designated as L3-L5. Hardware is intact. No hardware fracture. No perihardware lucency or fracture. Vertebral body heights are maintained without fracture or static listhesis. Mild intervertebral disc height loss of L2-3. IMPRESSION: 1. No acute fracture or subluxation involving the thoracic or lumbar spine. 2. Intact L3-L5 PLIF hardware. 3. Transitional lumbosacral anatomy. Electronically Signed   By: Duanne Guess  D.O.   On: 12/05/2019 15:14    Procedures Procedures (including critical care time)  Medications Ordered in ED Medications  cyclobenzaprine (FLEXERIL) tablet 10 mg (10 mg Oral Given 12/05/19 1407)  ibuprofen (ADVIL) tablet 800 mg (800 mg Oral Given 12/05/19 1407)  oxyCODONE-acetaminophen (PERCOCET/ROXICET) 5-325 MG per tablet 1 tablet (1 tablet Oral Given 12/05/19 1407)    ED Course  I have reviewed the triage vital signs and the nursing notes.  Pertinent labs & imaging results that were available during my care of the patient were reviewed by me and considered in my medical decision making (see chart for details).    MDM Rules/Calculators/A&P                      Here with injury to the lumbar spine.  I personally reviewed the patient's thoracic and lumbar plain films which show no evidence of acute fracture, subluxation.  He has no abnormality including loosening of the indwelling surgical fixators. The patient has pain localized to the lumbar spine.  There is no evidence of radiculopathy, myelopathy.  Patient has normal strength of the lower extremities.  I had a [place] conversation with the patient and his wife by telephone discussing his diagnosis of strain versus sprain, I discussed the fact that this was a case of pain control and currently do not have any admission criteria.  I explained that he would certainly be in a lot of discomfort but as Kho as he was able to get to and from the bathroom back to his bed that that would be excellent effort for the weekend.  He does have a walker at home he can utilize.  His wife is extremely concerned about him returning.  I discussed the fact that if he is failing to be able to ambulate or get out of his bed than that would be a reason to return to the emergency department however I do think it is appropriate given the fact that this is likely a strain or sprain injury to try outpatient management.  Patient was able to get into a wheelchair at  discharge time.  I did review the PDMP which shows that he has a chronic pain medications and we discussed that I could not change his narcotic regimen.  I did add Celebrex and baclofen.  I discussed reasons to seek immediate medical care.  Patient appears otherwise appropriate for discharge at this time with close outpatient follow-up with his neurosurgeon. Final Clinical Impression(s) / ED Diagnoses Final diagnoses:  None    Rx / DC Orders ED Discharge Orders    None       Arthor Captain, PA-C 12/05/19 2156    Bethann Berkshire, MD 12/09/19 1024

## 2019-12-05 NOTE — ED Notes (Signed)
Pt in bed, pt has positive sensation to touch in lower extremities, pt can move toes, denies loss of bowel or bladder function.  resps even and unlabored, pt states that he has had two spinal fusion and today he fell in the kitchen hitting his back, states that he always has 2/10 back pain and takes gabapentin and hydrocodone/tylenol 10-325 for pain at home.  Pt states that he is very anxious about his back, attempted to comfort pt.  Pt awaits md eval. Pt states that he received pain med in route.

## 2019-12-05 NOTE — Discharge Instructions (Addendum)
Apply ICE to the affected are 20 min at a time over the weekend. Avoid heat during the acute injury phase. Contact a health care provider if: Your back pain does not improve after 6 weeks of treatment. Your symptoms get worse. Get help right away if: Your back pain is severe. You are unable to stand or walk. You develop pain in your legs. You develop weakness in your buttocks or legs. You have difficulty controlling when you urinate or when you have a bowel movement. You have frequent, painful, or bloody urination. You have a temperature over 101.43F (38.3C).

## 2019-12-05 NOTE — ED Triage Notes (Signed)
Pt was playing with son in kitchen and fell back and hit lower back . Pt has hx of spinal fusion 11/20. Pt given total of  100 mg fentanyl en route and 4 mg zofran. FYI pt wife  tested positive for covid 15 days ago

## 2020-12-27 ENCOUNTER — Encounter (HOSPITAL_COMMUNITY): Payer: Self-pay | Admitting: Emergency Medicine

## 2020-12-27 ENCOUNTER — Emergency Department (HOSPITAL_COMMUNITY): Payer: 59

## 2020-12-27 ENCOUNTER — Emergency Department (HOSPITAL_COMMUNITY)
Admission: EM | Admit: 2020-12-27 | Discharge: 2020-12-27 | Disposition: A | Discharge status: Home / Self Care | Payer: 59 | Attending: Emergency Medicine | Admitting: Emergency Medicine

## 2020-12-27 ENCOUNTER — Other Ambulatory Visit: Payer: Self-pay

## 2020-12-27 DIAGNOSIS — Z87891 Personal history of nicotine dependence: Secondary | ICD-10-CM | POA: Diagnosis not present

## 2020-12-27 DIAGNOSIS — R0789 Other chest pain: Secondary | ICD-10-CM | POA: Insufficient documentation

## 2020-12-27 DIAGNOSIS — R0602 Shortness of breath: Secondary | ICD-10-CM | POA: Diagnosis not present

## 2020-12-27 DIAGNOSIS — K30 Functional dyspepsia: Secondary | ICD-10-CM | POA: Diagnosis not present

## 2020-12-27 DIAGNOSIS — I10 Essential (primary) hypertension: Secondary | ICD-10-CM | POA: Diagnosis not present

## 2020-12-27 DIAGNOSIS — R1013 Epigastric pain: Secondary | ICD-10-CM

## 2020-12-27 DIAGNOSIS — R079 Chest pain, unspecified: Secondary | ICD-10-CM | POA: Diagnosis present

## 2020-12-27 DIAGNOSIS — Z79899 Other long term (current) drug therapy: Secondary | ICD-10-CM | POA: Diagnosis not present

## 2020-12-27 LAB — COMPREHENSIVE METABOLIC PANEL
ALT: 27 U/L (ref 0–44)
AST: 17 U/L (ref 15–41)
Albumin: 4.2 g/dL (ref 3.5–5.0)
Alkaline Phosphatase: 63 U/L (ref 38–126)
Anion gap: 11 (ref 5–15)
BUN: 14 mg/dL (ref 6–20)
CO2: 25 mmol/L (ref 22–32)
Calcium: 9.5 mg/dL (ref 8.9–10.3)
Chloride: 96 mmol/L — ABNORMAL LOW (ref 98–111)
Creatinine, Ser: 0.96 mg/dL (ref 0.61–1.24)
GFR, Estimated: >60 mL/min (ref >60)
Glucose, Bld: 103 mg/dL — ABNORMAL HIGH (ref 70–99)
Potassium: 4.5 mmol/L (ref 3.5–5.1)
Sodium: 132 mmol/L — ABNORMAL LOW (ref 135–145)
Total Bilirubin: 0.9 mg/dL (ref 0.3–1.2)
Total Protein: 7.5 g/dL (ref 6.5–8.1)

## 2020-12-27 LAB — CBC WITH DIFFERENTIAL/PLATELET
Abs Immature Granulocytes: 0.05 10*3/uL (ref 0.00–0.07)
Basophils Absolute: 0.1 10*3/uL (ref 0.0–0.1)
Basophils Relative: 1 %
Eosinophils Absolute: 0.2 10*3/uL (ref 0.0–0.5)
Eosinophils Relative: 2 %
HCT: 43 % (ref 39.0–52.0)
Hemoglobin: 14.9 g/dL (ref 13.0–17.0)
Immature Granulocytes: 1 %
Lymphocytes Relative: 26 %
Lymphs Abs: 2.8 10*3/uL (ref 0.7–4.0)
MCH: 33 pg (ref 26.0–34.0)
MCHC: 34.7 g/dL (ref 30.0–36.0)
MCV: 95.1 fL (ref 80.0–100.0)
Monocytes Absolute: 0.8 10*3/uL (ref 0.1–1.0)
Monocytes Relative: 8 %
Neutro Abs: 6.8 10*3/uL (ref 1.7–7.7)
Neutrophils Relative %: 62 %
Platelets: 348 10*3/uL (ref 150–400)
RBC: 4.52 MIL/uL (ref 4.22–5.81)
RDW: 11.9 % (ref 11.5–15.5)
WBC: 10.8 10*3/uL — ABNORMAL HIGH (ref 4.0–10.5)
nRBC: 0 % (ref 0.0–0.2)

## 2020-12-27 LAB — TROPONIN I (HIGH SENSITIVITY)
Troponin I (High Sensitivity): <2 ng/L (ref <18)
Troponin I (High Sensitivity): <2 ng/L (ref <18)

## 2020-12-27 MED ORDER — ALUM & MAG HYDROXIDE-SIMETH 200-200-20 MG/5ML PO SUSP
30.0000 mL | Freq: Once | ORAL | Status: AC
  Administered 2020-12-27: 30 mL via ORAL
  Filled 2020-12-27: qty 30

## 2020-12-27 MED ORDER — SODIUM CHLORIDE 0.9 % IV BOLUS
1000.0000 mL | Freq: Once | INTRAVENOUS | Status: AC
  Administered 2020-12-27: 1000 mL via INTRAVENOUS

## 2020-12-27 MED ORDER — LIDOCAINE VISCOUS HCL 2 % MT SOLN
15.0000 mL | Freq: Once | OROMUCOSAL | Status: AC
  Administered 2020-12-27: 15 mL via ORAL
  Filled 2020-12-27: qty 15

## 2020-12-27 MED ORDER — ONDANSETRON HCL 4 MG/2ML IJ SOLN
4.0000 mg | Freq: Once | INTRAMUSCULAR | Status: AC
  Administered 2020-12-27: 4 mg via INTRAVENOUS
  Filled 2020-12-27: qty 2

## 2020-12-27 NOTE — ED Triage Notes (Signed)
Pt c/o of sob with exertion and left sided cp x 10 days

## 2020-12-27 NOTE — ED Provider Notes (Signed)
[facility] EMERGENCY DEPARTMENT Provider Note   CSN: [id number] Arrival date & time: 12/27/20  0945     History Chief Complaint  Patient presents with  . Shortness of Breath  . Chest Pain    Kenneth [name] is a 50 y.o. male.  HPI      Kenneth [name] is a 50 y.o. male with past medical history of anxiety, hypertension, and GERD who presents to the Emergency Department complaining of exertional shortness of breath and left-sided chest pain.  Symptoms have been present for more than 1 week.  Waxing and waning in severity.  He states that his chest pain worsens with eating certain foods and burning.  Denies history of coronary artery disease or PE.  Denies drug use.  No diaphoresis, arm, neck, or jaw pain.  No nausea or vomiting.  No alleviating factors.  He denies fever, chills, cough, and swelling of his lower extremities.    Past Medical History:  Diagnosis Date  . Anxiety   . Arthritis   . Diverticulitis   . GERD (gastroesophageal reflux disease)   . Hypertension     Patient Active Problem List   Diagnosis Date Noted  . Degenerative lumbar spinal stenosis 07/16/2019    Past Surgical History:  Procedure Laterality Date  . BACK SURGERY    . CHONDROPLASTY Right 04/29/2015   Procedure: CHONDROPLASTY;  Surgeon: [name], MD;  Location: [facility];  Service: Orthopedics;  Laterality: Right;  . KNEE ARTHROSCOPY WITH EXCISION PLICA Right 04/29/2015   Procedure: RIGHT KNEE ARTHROSCOPY, EXCISION OF PLICA, CHONDROPLASTY, PARTIAL MEDIAL MENISECTOMY AND PARTIAL LATERAL MENISECTOMY;  Surgeon: [name], MD;  Location: [facility];  Service: Orthopedics;  Laterality: Right;  . KNEE ARTHROSCOPY WITH LATERAL MENISECTOMY Right 04/29/2015   Procedure: KNEE ARTHROSCOPY WITH LATERAL MENISECTOMY;  Surgeon: [name], MD;  Location:  SURGERY CENTER;  Service: Orthopedics;  Laterality: Right;  . KNEE ARTHROSCOPY WITH MEDIAL MENISECTOMY  Right 04/29/2015   Procedure: KNEE ARTHROSCOPY WITH MEDIAL MENISECTOMY;  Surgeon: [name], MD;  Location:  SURGERY CENTER;  Service: Orthopedics;  Laterality: Right;  . KNEE SURGERY    . NASAL SEPTUM SURGERY  2004  . SPINAL FUSION    . TONSILLECTOMY     as a child       History reviewed. No pertinent family history.  Social History   Tobacco Use  . Smoking status: Former Smoker    Types: Cigarettes    Quit date: 2005    Years since quitting: 17.2  . Smokeless tobacco: Never Used  Substance Use Topics  . Alcohol use: No  . Drug use: No    Home Medications Prior to Admission medications   Medication Sig Start Date End Date Taking? Authorizing Provider  baclofen (LIORESAL) 10 MG tablet Take 1 tablet (10 mg total) by mouth 3 (three) times daily. 12/05/19   Arthor Captain, PA-C  celecoxib (CELEBREX) 200 MG capsule Take 1 capsule (200 mg total) by mouth 2 (two) times daily. 12/05/19   Arthor Captain, PA-C  Cholecalciferol (VITAMIN D) 50 MCG (2000 UT) tablet Take 2,000 Units by mouth daily.     [provider]  cyclobenzaprine (FLEXERIL) 10 MG tablet Take 1 tablet (10 mg total) by mouth 3 (three) times daily as needed for muscle spasms. 07/17/19   Tressie Stalker, MD  docusate sodium (COLACE) 100 MG capsule Take 1 capsule (100 mg total) by mouth 2 (two) times daily. 07/17/19  Tressie Stalker, MD  escitalopram (LEXAPRO) 10 MG tablet Take 10 mg by mouth daily.      [provider]  esomeprazole (NEXIUM) 40 MG capsule Take 40 mg by mouth daily before breakfast.      [provider]  gabapentin (NEURONTIN) 100 MG capsule Take 300 mg by mouth at bedtime.    [provider]  hydrALAZINE (APRESOLINE) 50 MG tablet Take 50 mg by mouth 2 (two) times daily.    [provider]  metoprolol (TOPROL-XL) 100 MG 24 hr tablet Take 100 mg by mouth at bedtime.     [provider]  Multiple Minerals-Vitamins  (CALCIUM-MAGNESIUM-ZINC-D3 PO) Take 1 tablet by mouth daily.    [provider]  oxyCODONE 10 MG TABS Take 1 tablet (10 mg total) by mouth every 3 (three) hours as needed for severe pain ((score 7 to 10)). 07/17/19   Tressie Stalker, MD    Allergies    Patient has no known allergies.  Review of Systems   Review of Systems  Constitutional: Negative for chills, diaphoresis, fatigue and fever.  Respiratory: Positive for shortness of breath. Negative for cough and wheezing.   Cardiovascular: Positive for chest pain. Negative for palpitations and leg swelling.  Gastrointestinal: Negative for abdominal pain, nausea and vomiting.  Genitourinary: Negative for dysuria and flank pain.  Musculoskeletal: Negative for arthralgias, neck pain and neck stiffness.  Skin: Negative for rash.  Neurological: Negative for dizziness, weakness, light-headedness, numbness and headaches.  Hematological: Does not bruise/bleed easily.    Physical Exam Updated Vital Signs Ht 5\' 10"  (1.778 m)   Wt 104.3 kg   BMI 33.00 kg/m   Physical Exam Vitals and nursing note reviewed.  Constitutional:      General: He is not in acute distress.    Appearance: He is well-developed. He is not ill-appearing, toxic-appearing or diaphoretic.  HENT:     Mouth/Throat:     Mouth: Mucous membranes are moist.  Eyes:     Conjunctiva/sclera: Conjunctivae normal.  Cardiovascular:     Rate and Rhythm: Normal rate and regular rhythm.     Pulses: Normal pulses.  Pulmonary:     Effort: Pulmonary effort is normal.     Breath sounds: Normal breath sounds.  Abdominal:     General: There is no distension.     Palpations: Abdomen is soft.     Tenderness: There is no abdominal tenderness.  Musculoskeletal:        General: Normal range of motion.     Cervical back: Normal range of motion.     Right lower leg: No edema.     Left lower leg: No edema.  Skin:    General: Skin is warm.     Capillary Refill: Capillary refill  takes less than 2 seconds.     Findings: No rash.  Neurological:     General: No focal deficit present.     Mental Status: He is alert.     Sensory: No sensory deficit.     Motor: No weakness.     ED Results / Procedures / Treatments   Labs (all labs ordered are listed, but only abnormal results are displayed) Labs Reviewed  COMPREHENSIVE METABOLIC PANEL - Abnormal; Notable for the following components:      Result Value   Sodium 132 (*)    Chloride 96 (*)    Glucose, Bld 103 (*)    All other components within normal limits  CBC WITH DIFFERENTIAL/PLATELET - Abnormal; Notable  for the following components:   WBC 10.8 (*)    All other components within normal limits  TROPONIN I (HIGH SENSITIVITY)  TROPONIN I (HIGH SENSITIVITY)    EKG EKG Interpretation  Date/Time:  Monday December 27 2020 10:10:10 EDT Ventricular Rate:  72 PR Interval:  199 QRS Duration: 89 QT Interval:  389 QTC Calculation: 426 R Axis:   34 Text Interpretation: Sinus rhythm Abnormal R-wave progression, early transition Minimal ST elevation, inferior leads NSR, no STEMI Confirmed by Coralee Pesa [phone]) on 12/27/2020 2:43:26 PM   Radiology DG Chest 2 View  Result Date: 12/27/2020 CLINICAL DATA:  Nausea and vomiting for 10 days EXAM: CHEST - 2 VIEW COMPARISON:  10/23/2011 FINDINGS: Normal heart size, mediastinal contours, and pulmonary vascularity. Lungs clear. Minimal chronic central peribronchial thickening. No infiltrate, pleural effusion, or pneumothorax. Prior cervical spine fusion. Visualized bowel gas pattern normal. IMPRESSION: No acute abnormalities. Electronically Signed   By: Ulyses Southward M.D.   On: 12/27/2020 11:27    Procedures Procedures   Medications Ordered in ED Medications - No data to display  ED Course  I have reviewed the triage vital signs and the nursing notes.  Pertinent labs & imaging results that were available during my care of the patient were reviewed by me and considered  in my medical decision making (see chart for details).    MDM Rules/Calculators/A&P                          Patient here with 10-day history of left-sided burning chest pain and exertional dyspnea.  Pain aggravated with food intake.  No abdominal pain, nausea or vomiting.  No history of cardiac disease or IV drug use.  Patient well-appearing.  Will obtain labs, chest x-ray and EKG.  Symptoms felt to be related to esophagitis.  PERC neg  On recheck, patient resting comfortably.  Reports feeling better after GI cocktail and ondansetron.  Labs interpreted by me show reassuring troponin, no significant leukocytosis or electrolyte derangement.  EKG without acute ischemic change.  Doubt ACS or PE.  Patient denies any symptoms.  Has famotidine at home.  Appears appropriate for discharge home, agrees to close follow-up with PCP this week.  Strict return precautions were discussed.  The patient appears reasonably screened and/or stabilized for discharge and I doubt any other medical condition or other [facility] requiring further screening, evaluation, or treatment in the ED at this time prior to discharge.    Final Clinical Impression(s) / ED Diagnoses Final diagnoses:  Atypical chest pain  Dyspepsia    Rx / DC Orders ED Discharge Orders    None       Pauline Aus, PA-C 12/30/20 1215    Horton, Clabe Seal, DO 12/30/20 2007

## 2020-12-27 NOTE — Discharge Instructions (Addendum)
Your work-up today was reassuring.  I would recommend that you continue your famotidine and Nexium as prescribed.   Also, I recommend that you follow-up with a gastroenterologist.  Your primary care provider may refer you to someone.  I have also listed Dr. Luvenia Starch information that you may contact to arrange follow-up.  Return to the emergency department if you develop any worsening symptoms.

## 2020-12-29 ENCOUNTER — Telehealth: Payer: Self-pay | Admitting: Internal Medicine

## 2020-12-29 NOTE — Telephone Encounter (Signed)
ER REFERRAL  

## 2020-12-29 NOTE — Telephone Encounter (Signed)
Ok to schedule ov.  

## 2021-01-24 ENCOUNTER — Other Ambulatory Visit: Payer: Self-pay | Admitting: Physician Assistant

## 2021-01-24 DIAGNOSIS — R1310 Dysphagia, unspecified: Secondary | ICD-10-CM

## 2021-02-09 ENCOUNTER — Ambulatory Visit: Payer: 59 | Admitting: Internal Medicine

## 2021-02-09 ENCOUNTER — Other Ambulatory Visit: Payer: Self-pay | Admitting: Physician Assistant

## 2021-02-09 ENCOUNTER — Ambulatory Visit
Admission: RE | Admit: 2021-02-09 | Discharge: 2021-02-09 | Disposition: A | Discharge status: Home / Self Care | Payer: 59 | Source: Ambulatory Visit | Attending: Physician Assistant | Admitting: Physician Assistant

## 2021-02-09 DIAGNOSIS — R1310 Dysphagia, unspecified: Secondary | ICD-10-CM

## 2021-03-19 IMAGING — RF DG C-ARM 1-60 MIN
1 series · 2 of 2 positions shown · non-contrast
Comparison: 03/28/2019 lumbar spine CT

CLINICAL DATA: PLIF L3-4.

EXAM:
LUMBAR SPINE - 2-3 VIEW; DG C-ARM 1-60 MIN

[Series 1: run · 2 of 2 slices shown]
[im 1/2]
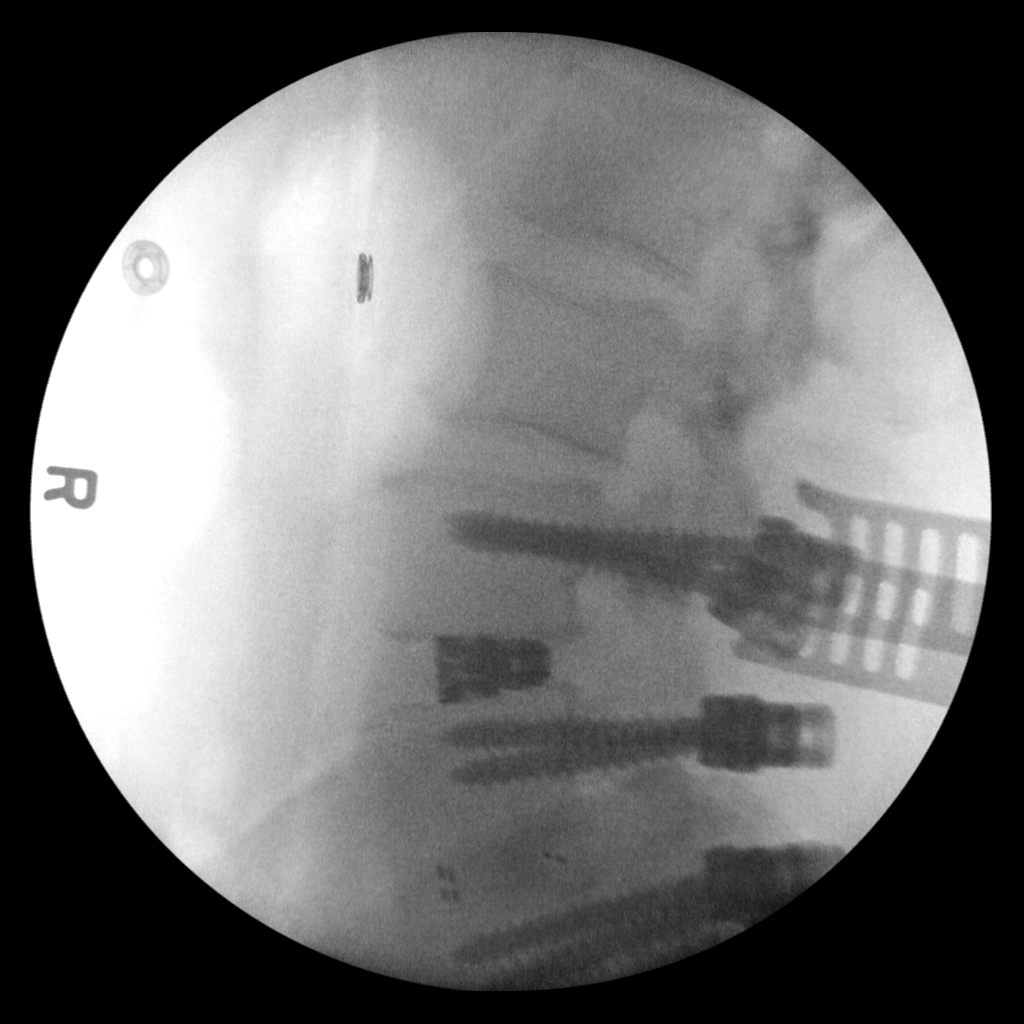
[im 2/2]
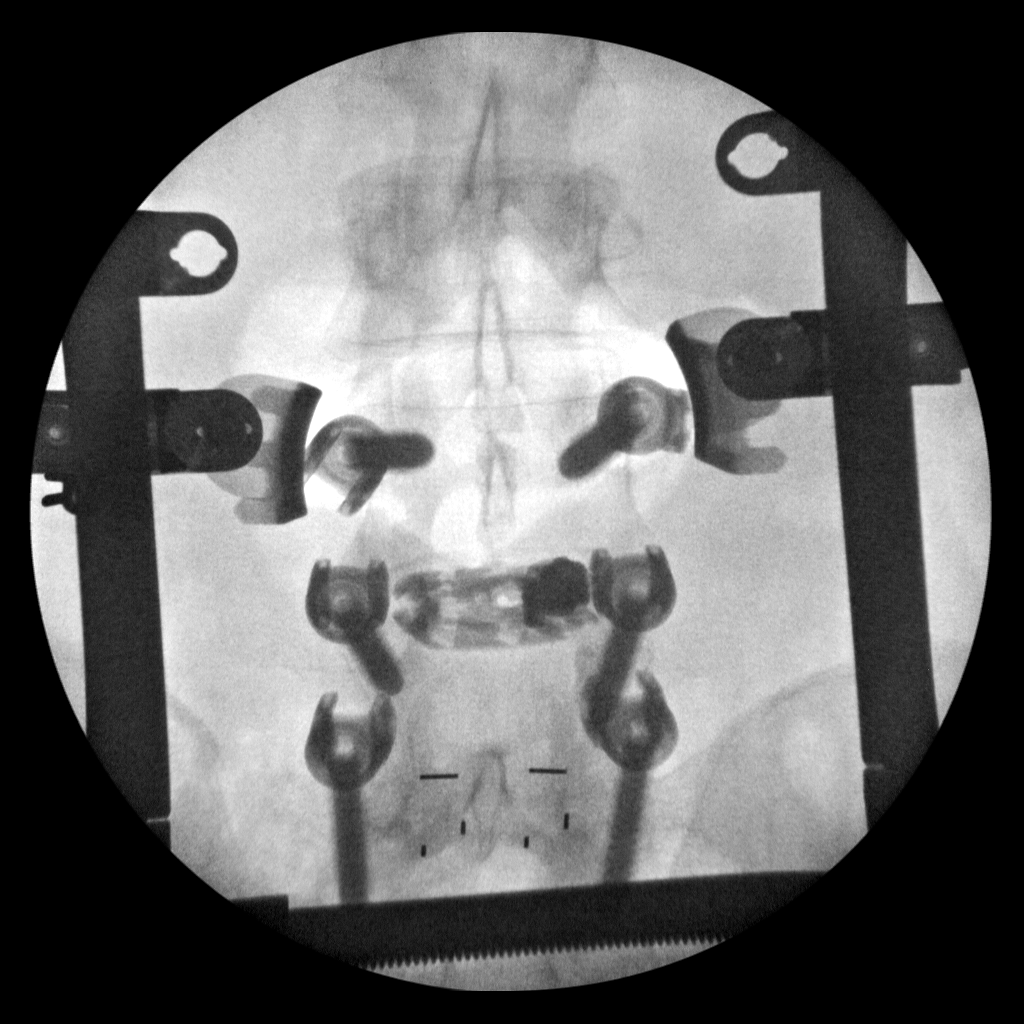

[2 of 2 positions shown; findings below may reference images not displayed]

FLUOROSCOPY TIME:  Fluoroscopy Time:  0 minutes 17 seconds

Number of Acquired Spot Images: 2
FINDINGS: Nondiagnostic spot fluoroscopic intraoperative lumbar spine
radiographs demonstrate bilateral pedicle screws at L3, L4 and L5
with new bone cage in the L3-4 disc space and previous bone cage in
the L4-5 disc space.
IMPRESSION: Intraoperative fluoroscopic guidance for PLIF L3-4.

## 2021-03-19 IMAGING — RF DG LUMBAR SPINE 2-3V
1 series · 2 of 2 positions shown · non-contrast
Comparison: 03/28/2019 lumbar spine CT

CLINICAL DATA: PLIF L3-4.

EXAM:
LUMBAR SPINE - 2-3 VIEW; DG C-ARM 1-60 MIN

[Series 1: run · 2 of 2 slices shown]
[im 1/2]
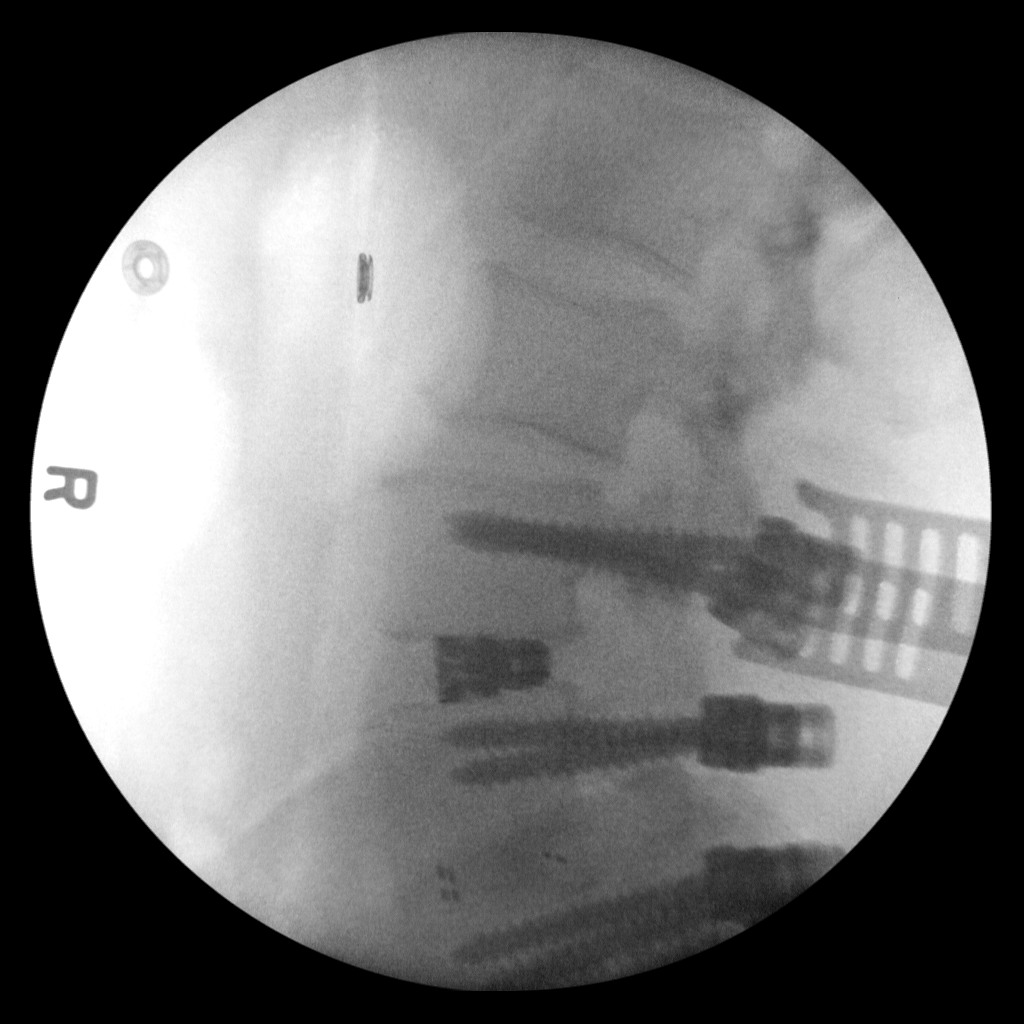
[im 2/2]
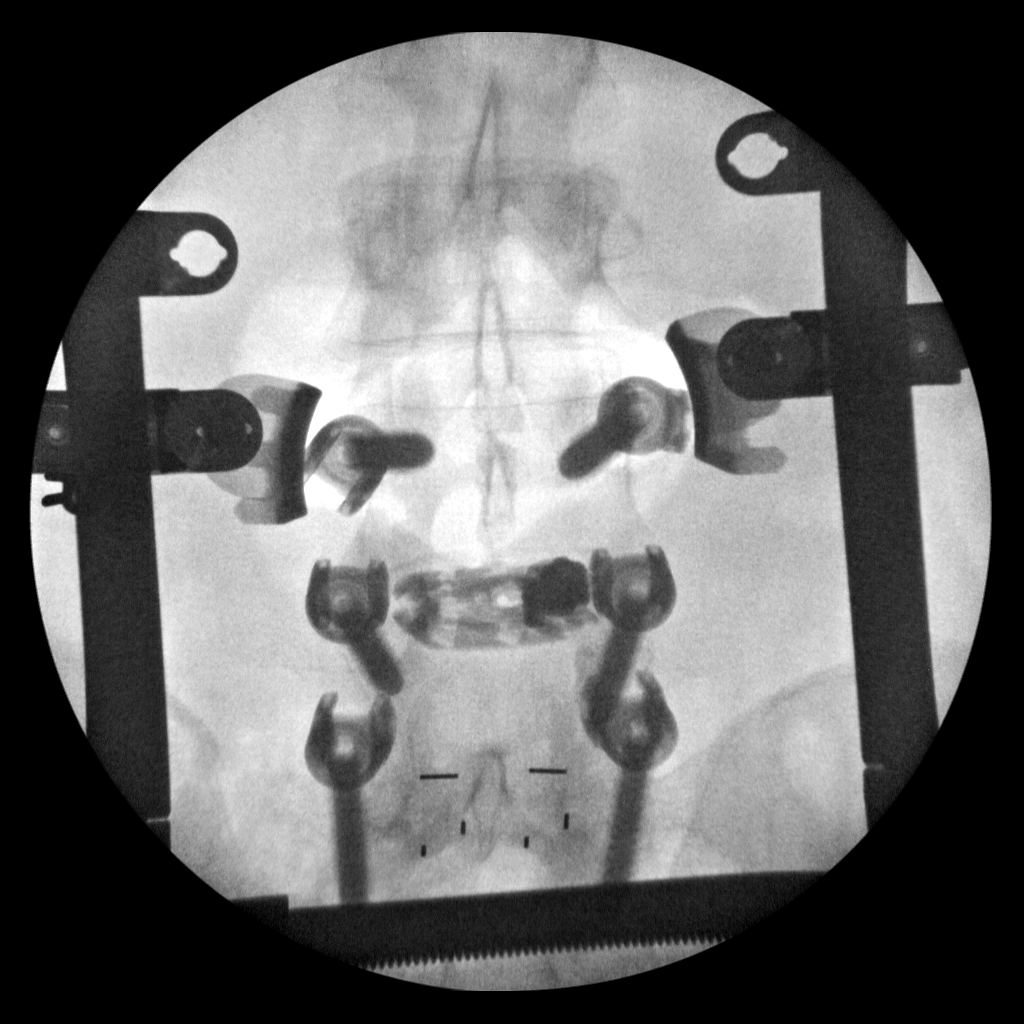

[2 of 2 positions shown; findings below may reference images not displayed]

FLUOROSCOPY TIME:  Fluoroscopy Time:  0 minutes 17 seconds

Number of Acquired Spot Images: 2
FINDINGS: Nondiagnostic spot fluoroscopic intraoperative lumbar spine
radiographs demonstrate bilateral pedicle screws at L3, L4 and L5
with new bone cage in the L3-4 disc space and previous bone cage in
the L4-5 disc space.
IMPRESSION: Intraoperative fluoroscopic guidance for PLIF L3-4.

## 2021-12-06 DIAGNOSIS — M5451 Vertebrogenic low back pain: Secondary | ICD-10-CM | POA: Diagnosis not present

## 2021-12-06 DIAGNOSIS — G4730 Sleep apnea, unspecified: Secondary | ICD-10-CM | POA: Diagnosis not present

## 2021-12-06 DIAGNOSIS — E782 Mixed hyperlipidemia: Secondary | ICD-10-CM | POA: Diagnosis not present

## 2021-12-06 DIAGNOSIS — I10 Essential (primary) hypertension: Secondary | ICD-10-CM | POA: Diagnosis not present

## 2022-01-11 DIAGNOSIS — M25511 Pain in right shoulder: Secondary | ICD-10-CM | POA: Diagnosis not present

## 2022-01-11 DIAGNOSIS — M542 Cervicalgia: Secondary | ICD-10-CM | POA: Diagnosis not present

## 2022-01-31 DIAGNOSIS — R29898 Other symptoms and signs involving the musculoskeletal system: Secondary | ICD-10-CM | POA: Diagnosis not present

## 2022-01-31 DIAGNOSIS — R200 Anesthesia of skin: Secondary | ICD-10-CM | POA: Diagnosis not present

## 2022-01-31 DIAGNOSIS — M4316 Spondylolisthesis, lumbar region: Secondary | ICD-10-CM | POA: Diagnosis not present

## 2022-02-07 DIAGNOSIS — R29898 Other symptoms and signs involving the musculoskeletal system: Secondary | ICD-10-CM | POA: Diagnosis not present

## 2022-02-07 DIAGNOSIS — M542 Cervicalgia: Secondary | ICD-10-CM | POA: Diagnosis not present

## 2022-03-06 DIAGNOSIS — M545 Low back pain, unspecified: Secondary | ICD-10-CM | POA: Diagnosis not present

## 2022-03-06 DIAGNOSIS — I10 Essential (primary) hypertension: Secondary | ICD-10-CM | POA: Diagnosis not present

## 2022-03-06 DIAGNOSIS — M109 Gout, unspecified: Secondary | ICD-10-CM | POA: Diagnosis not present

## 2022-03-06 DIAGNOSIS — G4730 Sleep apnea, unspecified: Secondary | ICD-10-CM | POA: Diagnosis not present

## 2022-03-06 DIAGNOSIS — E782 Mixed hyperlipidemia: Secondary | ICD-10-CM | POA: Diagnosis not present

## 2022-05-18 DIAGNOSIS — M545 Low back pain, unspecified: Secondary | ICD-10-CM | POA: Diagnosis not present

## 2022-05-18 DIAGNOSIS — G894 Chronic pain syndrome: Secondary | ICD-10-CM | POA: Diagnosis not present

## 2022-05-18 DIAGNOSIS — G4730 Sleep apnea, unspecified: Secondary | ICD-10-CM | POA: Diagnosis not present

## 2022-05-18 DIAGNOSIS — E559 Vitamin D deficiency, unspecified: Secondary | ICD-10-CM | POA: Diagnosis not present

## 2022-05-18 DIAGNOSIS — Z1211 Encounter for screening for malignant neoplasm of colon: Secondary | ICD-10-CM | POA: Diagnosis not present

## 2022-05-18 DIAGNOSIS — Z0000 Encounter for general adult medical examination without abnormal findings: Secondary | ICD-10-CM | POA: Diagnosis not present

## 2022-05-18 DIAGNOSIS — M109 Gout, unspecified: Secondary | ICD-10-CM | POA: Diagnosis not present

## 2022-05-18 DIAGNOSIS — I10 Essential (primary) hypertension: Secondary | ICD-10-CM | POA: Diagnosis not present

## 2022-05-18 DIAGNOSIS — E782 Mixed hyperlipidemia: Secondary | ICD-10-CM | POA: Diagnosis not present

## 2022-06-14 DIAGNOSIS — M1712 Unilateral primary osteoarthritis, left knee: Secondary | ICD-10-CM | POA: Diagnosis not present

## 2022-06-14 DIAGNOSIS — M79662 Pain in left lower leg: Secondary | ICD-10-CM | POA: Diagnosis not present

## 2022-07-13 DIAGNOSIS — R200 Anesthesia of skin: Secondary | ICD-10-CM | POA: Diagnosis not present

## 2022-08-03 DIAGNOSIS — I10 Essential (primary) hypertension: Secondary | ICD-10-CM | POA: Diagnosis not present

## 2022-08-03 DIAGNOSIS — R7303 Prediabetes: Secondary | ICD-10-CM | POA: Diagnosis not present

## 2022-08-03 DIAGNOSIS — M5416 Radiculopathy, lumbar region: Secondary | ICD-10-CM | POA: Diagnosis not present

## 2022-08-03 DIAGNOSIS — K219 Gastro-esophageal reflux disease without esophagitis: Secondary | ICD-10-CM | POA: Diagnosis not present

## 2022-08-28 DIAGNOSIS — G5601 Carpal tunnel syndrome, right upper limb: Secondary | ICD-10-CM | POA: Diagnosis not present

## 2022-11-06 DIAGNOSIS — M961 Postlaminectomy syndrome, not elsewhere classified: Secondary | ICD-10-CM | POA: Diagnosis not present

## 2022-11-06 DIAGNOSIS — I10 Essential (primary) hypertension: Secondary | ICD-10-CM | POA: Diagnosis not present

## 2022-11-29 DIAGNOSIS — G5601 Carpal tunnel syndrome, right upper limb: Secondary | ICD-10-CM | POA: Diagnosis not present

## 2022-11-29 DIAGNOSIS — G5602 Carpal tunnel syndrome, left upper limb: Secondary | ICD-10-CM | POA: Diagnosis not present

## 2023-01-19 DIAGNOSIS — M5136 Other intervertebral disc degeneration, lumbar region: Secondary | ICD-10-CM | POA: Diagnosis not present

## 2023-01-19 DIAGNOSIS — G5601 Carpal tunnel syndrome, right upper limb: Secondary | ICD-10-CM | POA: Diagnosis not present

## 2023-02-06 DIAGNOSIS — M5117 Intervertebral disc disorders with radiculopathy, lumbosacral region: Secondary | ICD-10-CM | POA: Diagnosis not present

## 2023-02-06 DIAGNOSIS — M5136 Other intervertebral disc degeneration, lumbar region: Secondary | ICD-10-CM | POA: Diagnosis not present

## 2023-02-13 ENCOUNTER — Other Ambulatory Visit: Payer: Self-pay | Admitting: Psychiatry

## 2023-02-13 DIAGNOSIS — M961 Postlaminectomy syndrome, not elsewhere classified: Secondary | ICD-10-CM | POA: Diagnosis not present

## 2023-02-13 DIAGNOSIS — I10 Essential (primary) hypertension: Secondary | ICD-10-CM | POA: Diagnosis not present

## 2023-02-13 DIAGNOSIS — R7303 Prediabetes: Secondary | ICD-10-CM | POA: Diagnosis not present

## 2023-02-13 DIAGNOSIS — R1032 Left lower quadrant pain: Secondary | ICD-10-CM | POA: Diagnosis not present

## 2023-02-16 DIAGNOSIS — T8484XA Pain due to internal orthopedic prosthetic devices, implants and grafts, initial encounter: Secondary | ICD-10-CM | POA: Diagnosis not present

## 2023-02-19 ENCOUNTER — Other Ambulatory Visit (HOSPITAL_COMMUNITY): Payer: Self-pay | Admitting: Orthopedic Surgery

## 2023-02-19 DIAGNOSIS — Z96659 Presence of unspecified artificial knee joint: Secondary | ICD-10-CM

## 2023-02-21 ENCOUNTER — Ambulatory Visit
Admission: RE | Admit: 2023-02-21 | Discharge: 2023-02-21 | Disposition: A | Discharge status: Home / Self Care | Payer: 59 | Source: Ambulatory Visit | Attending: Psychiatry | Admitting: Psychiatry

## 2023-02-21 DIAGNOSIS — R1032 Left lower quadrant pain: Secondary | ICD-10-CM

## 2023-02-21 DIAGNOSIS — K5730 Diverticulosis of large intestine without perforation or abscess without bleeding: Secondary | ICD-10-CM | POA: Diagnosis not present

## 2023-02-21 DIAGNOSIS — K4020 Bilateral inguinal hernia, without obstruction or gangrene, not specified as recurrent: Secondary | ICD-10-CM | POA: Diagnosis not present

## 2023-02-21 DIAGNOSIS — I700 Atherosclerosis of aorta: Secondary | ICD-10-CM | POA: Diagnosis not present

## 2023-02-23 ENCOUNTER — Encounter (HOSPITAL_COMMUNITY)
Admission: RE | Admit: 2023-02-23 | Discharge: 2023-02-23 | Disposition: A | Discharge status: Home / Self Care | Payer: Medicare Other | Source: Ambulatory Visit | Attending: Orthopedic Surgery | Admitting: Orthopedic Surgery

## 2023-02-23 DIAGNOSIS — T84038A Mechanical loosening of other internal prosthetic joint, initial encounter: Secondary | ICD-10-CM | POA: Insufficient documentation

## 2023-02-23 DIAGNOSIS — M25561 Pain in right knee: Secondary | ICD-10-CM | POA: Diagnosis not present

## 2023-02-23 DIAGNOSIS — Z96659 Presence of unspecified artificial knee joint: Secondary | ICD-10-CM | POA: Diagnosis not present

## 2023-02-23 MED ORDER — TECHNETIUM TC 99M MEDRONATE IV KIT
20.0000 | PACK | Freq: Once | INTRAVENOUS | Status: AC | PRN
  Administered 2023-02-23: 20 via INTRAVENOUS

## 2023-03-02 DIAGNOSIS — T8484XA Pain due to internal orthopedic prosthetic devices, implants and grafts, initial encounter: Secondary | ICD-10-CM | POA: Diagnosis not present

## 2023-03-02 DIAGNOSIS — T8484XD Pain due to internal orthopedic prosthetic devices, implants and grafts, subsequent encounter: Secondary | ICD-10-CM | POA: Diagnosis not present

## 2023-03-02 DIAGNOSIS — M25561 Pain in right knee: Secondary | ICD-10-CM | POA: Diagnosis not present

## 2023-03-08 DIAGNOSIS — M25561 Pain in right knee: Secondary | ICD-10-CM | POA: Diagnosis not present

## 2023-03-15 DIAGNOSIS — K4020 Bilateral inguinal hernia, without obstruction or gangrene, not specified as recurrent: Secondary | ICD-10-CM | POA: Diagnosis not present

## 2023-03-15 DIAGNOSIS — M961 Postlaminectomy syndrome, not elsewhere classified: Secondary | ICD-10-CM | POA: Diagnosis not present

## 2023-03-15 DIAGNOSIS — I10 Essential (primary) hypertension: Secondary | ICD-10-CM | POA: Diagnosis not present

## 2023-03-16 DIAGNOSIS — M4316 Spondylolisthesis, lumbar region: Secondary | ICD-10-CM | POA: Diagnosis not present

## 2023-05-23 DIAGNOSIS — Z8739 Personal history of other diseases of the musculoskeletal system and connective tissue: Secondary | ICD-10-CM | POA: Diagnosis not present

## 2023-05-23 DIAGNOSIS — E782 Mixed hyperlipidemia: Secondary | ICD-10-CM | POA: Diagnosis not present

## 2023-05-23 DIAGNOSIS — R7303 Prediabetes: Secondary | ICD-10-CM | POA: Diagnosis not present

## 2023-05-23 DIAGNOSIS — K4020 Bilateral inguinal hernia, without obstruction or gangrene, not specified as recurrent: Secondary | ICD-10-CM | POA: Diagnosis not present

## 2023-05-23 DIAGNOSIS — M961 Postlaminectomy syndrome, not elsewhere classified: Secondary | ICD-10-CM | POA: Diagnosis not present

## 2023-05-23 DIAGNOSIS — E559 Vitamin D deficiency, unspecified: Secondary | ICD-10-CM | POA: Diagnosis not present

## 2023-05-23 DIAGNOSIS — Z0001 Encounter for general adult medical examination with abnormal findings: Secondary | ICD-10-CM | POA: Diagnosis not present

## 2023-05-23 DIAGNOSIS — G4730 Sleep apnea, unspecified: Secondary | ICD-10-CM | POA: Diagnosis not present

## 2023-05-23 DIAGNOSIS — Z79899 Other long term (current) drug therapy: Secondary | ICD-10-CM | POA: Diagnosis not present

## 2023-05-23 DIAGNOSIS — I10 Essential (primary) hypertension: Secondary | ICD-10-CM | POA: Diagnosis not present

## 2023-06-28 ENCOUNTER — Ambulatory Visit: Payer: Self-pay | Admitting: General Surgery

## 2023-06-28 DIAGNOSIS — K4020 Bilateral inguinal hernia, without obstruction or gangrene, not specified as recurrent: Secondary | ICD-10-CM | POA: Diagnosis not present

## 2023-08-22 DIAGNOSIS — G894 Chronic pain syndrome: Secondary | ICD-10-CM | POA: Diagnosis not present

## 2023-08-22 DIAGNOSIS — I700 Atherosclerosis of aorta: Secondary | ICD-10-CM | POA: Diagnosis not present

## 2023-08-22 DIAGNOSIS — K4020 Bilateral inguinal hernia, without obstruction or gangrene, not specified as recurrent: Secondary | ICD-10-CM | POA: Diagnosis not present

## 2023-08-22 NOTE — Pre-Procedure Instructions (Signed)
Surgical Instructions   Your procedure is scheduled on August 28, 2023. Report to [facility] Main Entrance "A" at 10:00 A.M., then check in with the Admitting office. Any questions or running late day of surgery: call [phone]  Questions prior to your surgery date: call [phone], Monday-Friday, 8am-4pm. If you experience any cold or flu symptoms such as cough, fever, chills, shortness of breath, etc. between now and your scheduled surgery, please notify us at the above number.     Remember:  Do not eat or drink after midnight the night before your surgery    Take these medicines the morning of surgery with A SIP OF WATER: allopurinol (ZYLOPRIM)  amLODipine (NORVASC)  escitalopram (LEXAPRO)  esomeprazole (NEXIUM)  famotidine (PEPCID)  HYDROcodone-acetaminophen (NORCO)    One week prior to surgery, STOP taking any Aspirin (unless otherwise instructed by your surgeon) Aleve, Naproxen, Ibuprofen, Motrin, Advil, Goody's, BC's, all herbal medications, fish oil, and non-prescription vitamins.                     Do NOT Smoke (Tobacco/Vaping) for 24 hours prior to your procedure.  If you use a CPAP at night, you may bring your mask/headgear for your overnight stay.   You will be asked to remove any contacts, glasses, piercing's, hearing aid's, dentures/partials prior to surgery. Please bring cases for these items if needed.    Patients discharged the day of surgery will not be allowed to drive home, and someone needs to stay with them for 24 hours.  SURGICAL WAITING ROOM VISITATION Patients may have no more than 2 support people in the waiting area - these visitors may rotate.   Pre-op nurse will coordinate an appropriate time for 1 ADULT support person, who may not rotate, to accompany patient in pre-op.  Children under the age of 43 must have an adult with them who is not the patient and must remain in the main waiting area with an adult.  If the patient needs to stay at  the hospital during part of their recovery, the visitor guidelines for inpatient rooms apply.  Please refer to the [facility] website for the visitor guidelines for any additional information.   If you received a COVID test during your pre-op visit  it is requested that you wear a mask when out in public, stay away from anyone that may not be feeling well and notify your surgeon if you develop symptoms. If you have been in contact with anyone that has tested positive in the last 10 days please notify you surgeon.      Pre-operative CHG Bathing Instructions   You can play a key role in reducing the risk of infection after surgery. Your skin needs to be as free of germs as possible. You can reduce the number of germs on your skin by washing with CHG (chlorhexidine gluconate) soap before surgery. CHG is an antiseptic soap that kills germs and continues to kill germs even after washing.   DO NOT use if you have an allergy to chlorhexidine/CHG or antibacterial soaps. If your skin becomes reddened or irritated, stop using the CHG and notify one of our RNs at [phone].              TAKE A SHOWER THE NIGHT BEFORE SURGERY AND THE DAY OF SURGERY    Please keep in mind the following:  DO NOT shave, including legs and underarms, 48 hours prior to surgery.   You may shave your face  before/day of surgery.  Place clean sheets on your bed the night before surgery Use a clean washcloth (not used since being washed) for each shower. DO NOT sleep with pet's night before surgery.  CHG Shower Instructions:  Wash your face and private area with normal soap. If you choose to wash your hair, wash first with your normal shampoo.  After you use shampoo/soap, rinse your hair and body thoroughly to remove shampoo/soap residue.  Turn the water OFF and apply half the bottle of CHG soap to a CLEAN washcloth.  Apply CHG soap ONLY FROM YOUR NECK DOWN TO YOUR TOES (washing for 3-5 minutes)  DO NOT use CHG soap on  face, private areas, open wounds, or sores.  Pay special attention to the area where your surgery is being performed.  If you are having back surgery, having someone wash your back for you may be helpful. Wait 2 minutes after CHG soap is applied, then you may rinse off the CHG soap.  Pat dry with a clean towel  Put on clean pajamas    Additional instructions for the day of surgery: DO NOT APPLY any lotions, deodorants, cologne, or perfumes.   Do not wear jewelry or makeup Do not wear nail polish, gel polish, artificial nails, or any other type of covering on natural nails (fingers and toes) Do not bring valuables to the hospital. [facility] is not responsible for valuables/personal belongings. Put on clean/comfortable clothes.  Please brush your teeth.  Ask your nurse before applying any prescription medications to the skin.

## 2023-08-23 ENCOUNTER — Encounter (HOSPITAL_COMMUNITY): Payer: Self-pay

## 2023-08-23 ENCOUNTER — Encounter (HOSPITAL_COMMUNITY)
Admission: RE | Admit: 2023-08-23 | Discharge: 2023-08-23 | Disposition: A | Discharge status: Home / Self Care | Payer: Medicare Other | Source: Ambulatory Visit | Attending: General Surgery | Admitting: General Surgery

## 2023-08-23 ENCOUNTER — Other Ambulatory Visit: Payer: Self-pay

## 2023-08-23 VITALS — BP 138/103 | HR 81 | Temp 98.2°F | Resp 17 | Ht 70.0 in | Wt 234.5 lb

## 2023-08-23 DIAGNOSIS — Z01818 Encounter for other preprocedural examination: Secondary | ICD-10-CM | POA: Insufficient documentation

## 2023-08-23 LAB — CBC
HCT: 41.5 % (ref 39.0–52.0)
Hemoglobin: 14.1 g/dL (ref 13.0–17.0)
MCH: 31.8 pg (ref 26.0–34.0)
MCHC: 34 g/dL (ref 30.0–36.0)
MCV: 93.7 fL (ref 80.0–100.0)
Platelets: 292 10*3/uL (ref 150–400)
RBC: 4.43 MIL/uL (ref 4.22–5.81)
RDW: 12.4 % (ref 11.5–15.5)
WBC: 8.2 10*3/uL (ref 4.0–10.5)
nRBC: 0 % (ref 0.0–0.2)

## 2023-08-23 LAB — BASIC METABOLIC PANEL
Anion gap: 9 (ref 5–15)
BUN: 14 mg/dL (ref 6–20)
CO2: 28 mmol/L (ref 22–32)
Calcium: 9.7 mg/dL (ref 8.9–10.3)
Chloride: 100 mmol/L (ref 98–111)
Creatinine, Ser: 1.07 mg/dL (ref 0.61–1.24)
GFR, Estimated: >60 mL/min (ref >60)
Glucose, Bld: 131 mg/dL — ABNORMAL HIGH (ref 70–99)
Potassium: 3.7 mmol/L (ref 3.5–5.1)
Sodium: 137 mmol/L (ref 135–145)

## 2023-08-23 NOTE — Progress Notes (Signed)
PCP - Dr. Hillard Danker Cardiologist - Denies  PPM/ICD - DENIES Device Orders - N/A Rep Notified - N/A  Chest x-ray - N/A EKG - 08/23/23 Stress Test - DENIES ECHO - DENIES Cardiac Cath - DENIES  Sleep Study - DENIES CPAP - N/A  Fasting Blood Sugar - DENIES Checks Blood Sugar _____ times a day N/A  Last dose of GLP1 agonist-  N/A GLP1 instructions: N/A  Blood Thinner Instructions: Y Aspirin Instructions: Y  ERAS Protcol - NPO PRE-SURGERY Ensure or G2- NPO  COVID TEST- N/A   Anesthesia review: N  Patient denies shortness of breath, fever, cough and chest pain at PAT appointment. Patient denies any respiratory issues at this time.    All instructions explained to the patient, with a verbal understanding of the material. Patient agrees to go over the instructions while at home for a better understanding. Patient also instructed to self quarantine after being tested for COVID-19. The opportunity to ask questions was provided.

## 2023-08-23 NOTE — Pre-Procedure Instructions (Signed)
Surgical Instructions   Your procedure is scheduled on August 28, 2023. Report to [facility] Main Entrance "A" at 5:30 A.M., then check in with the Admitting office. Any questions or running late day of surgery: call [phone]  Questions prior to your surgery date: call [phone], Monday-Friday, 8am-4pm. If you experience any cold or flu symptoms such as cough, fever, chills, shortness of breath, etc. between now and your scheduled surgery, please notify us at the above number.     Remember:  Do not eat or drink after midnight the night before your surgery    Take these medicines the morning of surgery with A SIP OF WATER: allopurinol (ZYLOPRIM)  amLODipine (NORVASC)  escitalopram (LEXAPRO)  esomeprazole (NEXIUM)  famotidine (PEPCID)  HYDROcodone-acetaminophen (NORCO)    One week prior to surgery, STOP taking any Aspirin (unless otherwise instructed by your surgeon) Aleve, Naproxen, Ibuprofen, Motrin, Advil, Goody's, BC's, all herbal medications, fish oil, and non-prescription vitamins.                     Do NOT Smoke (Tobacco/Vaping) for 24 hours prior to your procedure.  If you use a CPAP at night, you may bring your mask/headgear for your overnight stay.   You will be asked to remove any contacts, glasses, piercing's, hearing aid's, dentures/partials prior to surgery. Please bring cases for these items if needed.    Patients discharged the day of surgery will not be allowed to drive home, and someone needs to stay with them for 24 hours.  SURGICAL WAITING ROOM VISITATION Patients may have no more than 2 support people in the waiting area - these visitors may rotate.   Pre-op nurse will coordinate an appropriate time for 1 ADULT support person, who may not rotate, to accompany patient in pre-op.  Children under the age of 56 must have an adult with them who is not the patient and must remain in the main waiting area with an adult.  If the patient needs to stay at  the hospital during part of their recovery, the visitor guidelines for inpatient rooms apply.  Please refer to the [facility] website for the visitor guidelines for any additional information.   If you received a COVID test during your pre-op visit  it is requested that you wear a mask when out in public, stay away from anyone that may not be feeling well and notify your surgeon if you develop symptoms. If you have been in contact with anyone that has tested positive in the last 10 days please notify you surgeon.      Pre-operative CHG Bathing Instructions   You can play a key role in reducing the risk of infection after surgery. Your skin needs to be as free of germs as possible. You can reduce the number of germs on your skin by washing with CHG (chlorhexidine gluconate) soap before surgery. CHG is an antiseptic soap that kills germs and continues to kill germs even after washing.   DO NOT use if you have an allergy to chlorhexidine/CHG or antibacterial soaps. If your skin becomes reddened or irritated, stop using the CHG and notify one of our RNs at [phone].              TAKE A SHOWER THE NIGHT BEFORE SURGERY AND THE DAY OF SURGERY    Please keep in mind the following:  DO NOT shave, including legs and underarms, 48 hours prior to surgery.   You may shave your face  before/day of surgery.  Place clean sheets on your bed the night before surgery Use a clean washcloth (not used since being washed) for each shower. DO NOT sleep with pet's night before surgery.  CHG Shower Instructions:  Wash your face and private area with normal soap. If you choose to wash your hair, wash first with your normal shampoo.  After you use shampoo/soap, rinse your hair and body thoroughly to remove shampoo/soap residue.  Turn the water OFF and apply half the bottle of CHG soap to a CLEAN washcloth.  Apply CHG soap ONLY FROM YOUR NECK DOWN TO YOUR TOES (washing for 3-5 minutes)  DO NOT use CHG soap on  face, private areas, open wounds, or sores.  Pay special attention to the area where your surgery is being performed.  If you are having back surgery, having someone wash your back for you may be helpful. Wait 2 minutes after CHG soap is applied, then you may rinse off the CHG soap.  Pat dry with a clean towel  Put on clean pajamas    Additional instructions for the day of surgery: DO NOT APPLY any lotions, deodorants, cologne, or perfumes.   Do not wear jewelry or makeup Do not wear nail polish, gel polish, artificial nails, or any other type of covering on natural nails (fingers and toes) Do not bring valuables to the hospital. [facility] is not responsible for valuables/personal belongings. Put on clean/comfortable clothes.  Please brush your teeth.  Ask your nurse before applying any prescription medications to the skin.

## 2023-08-28 ENCOUNTER — Ambulatory Visit (HOSPITAL_COMMUNITY): Payer: Medicare Other | Admitting: Anesthesiology

## 2023-08-28 ENCOUNTER — Other Ambulatory Visit: Payer: Self-pay

## 2023-08-28 ENCOUNTER — Encounter (HOSPITAL_COMMUNITY)
Admission: RE | Disposition: A | Discharge status: Home / Self Care | Payer: Self-pay | Source: Home / Self Care | Attending: General Surgery

## 2023-08-28 ENCOUNTER — Ambulatory Visit (HOSPITAL_COMMUNITY)
Admission: RE | Admit: 2023-08-28 | Discharge: 2023-08-28 | Disposition: A | Discharge status: Home / Self Care | Payer: Medicare Other | Attending: General Surgery | Admitting: General Surgery

## 2023-08-28 DIAGNOSIS — K4020 Bilateral inguinal hernia, without obstruction or gangrene, not specified as recurrent: Secondary | ICD-10-CM

## 2023-08-28 DIAGNOSIS — K219 Gastro-esophageal reflux disease without esophagitis: Secondary | ICD-10-CM | POA: Insufficient documentation

## 2023-08-28 DIAGNOSIS — Z87891 Personal history of nicotine dependence: Secondary | ICD-10-CM | POA: Diagnosis not present

## 2023-08-28 DIAGNOSIS — I10 Essential (primary) hypertension: Secondary | ICD-10-CM | POA: Diagnosis not present

## 2023-08-28 DIAGNOSIS — F419 Anxiety disorder, unspecified: Secondary | ICD-10-CM | POA: Diagnosis not present

## 2023-08-28 DIAGNOSIS — G8918 Other acute postprocedural pain: Secondary | ICD-10-CM | POA: Diagnosis not present

## 2023-08-28 SURGERY — REPAIR, HERNIA, INGUINAL, BILATERAL, ROBOT-ASSISTED
Anesthesia: General | Site: Abdomen

## 2023-08-28 MED ORDER — CHLORHEXIDINE GLUCONATE CLOTH 2 % EX PADS: 6.0000 | MEDICATED_PAD | Freq: Once | CUTANEOUS | Status: DC

## 2023-08-28 MED ORDER — MIDAZOLAM HCL 2 MG/2ML IJ SOLN
INTRAMUSCULAR | Status: DC | PRN
  Administered 2023-08-28: 2 mg via INTRAVENOUS

## 2023-08-28 MED ORDER — PHENYLEPHRINE HCL-NACL 20-0.9 MG/250ML-% IV SOLN
INTRAVENOUS | Status: DC | PRN
  Administered 2023-08-28: 20 ug/min via INTRAVENOUS

## 2023-08-28 MED ORDER — OXYCODONE HCL 5 MG PO TABS
5.0000 mg | ORAL_TABLET | Freq: Once | ORAL | Status: AC | PRN
  Administered 2023-08-28: 5 mg via ORAL

## 2023-08-28 MED ORDER — ACETAMINOPHEN 10 MG/ML IV SOLN
1000.0000 mg | Freq: Once | INTRAVENOUS | Status: DC | PRN
  Administered 2023-08-28: 1000 mg via INTRAVENOUS

## 2023-08-28 MED ORDER — OXYCODONE HCL 5 MG PO TABS
ORAL_TABLET | ORAL | Status: AC
  Filled 2023-08-28: qty 1

## 2023-08-28 MED ORDER — LIDOCAINE 2% (20 MG/ML) 5 ML SYRINGE
INTRAMUSCULAR | Status: DC | PRN
  Administered 2023-08-28: 60 mg via INTRAVENOUS

## 2023-08-28 MED ORDER — FENTANYL CITRATE (PF) 250 MCG/5ML IJ SOLN
INTRAMUSCULAR | Status: AC
  Filled 2023-08-28: qty 5

## 2023-08-28 MED ORDER — SODIUM CHLORIDE 0.9% FLUSH: 3.0000 mL | INTRAVENOUS | Status: DC | PRN

## 2023-08-28 MED ORDER — BUPIVACAINE HCL (PF) 0.25 % IJ SOLN
INTRAMUSCULAR | Status: AC
  Filled 2023-08-28: qty 30

## 2023-08-28 MED ORDER — PHENYLEPHRINE 80 MCG/ML (10ML) SYRINGE FOR IV PUSH (FOR BLOOD PRESSURE SUPPORT)
PREFILLED_SYRINGE | INTRAVENOUS | Status: DC | PRN
  Administered 2023-08-28: 160 ug via INTRAVENOUS
  Administered 2023-08-28: 80 ug via INTRAVENOUS

## 2023-08-28 MED ORDER — SODIUM CHLORIDE 0.9 % IV SOLN: 250.0000 mL | INTRAVENOUS | Status: DC | PRN

## 2023-08-28 MED ORDER — OXYCODONE HCL 5 MG/5ML PO SOLN: 5.0000 mg | Freq: Once | ORAL | Status: AC | PRN

## 2023-08-28 MED ORDER — PHENYLEPHRINE 80 MCG/ML (10ML) SYRINGE FOR IV PUSH (FOR BLOOD PRESSURE SUPPORT)
PREFILLED_SYRINGE | INTRAVENOUS | Status: AC
  Filled 2023-08-28: qty 10

## 2023-08-28 MED ORDER — SUGAMMADEX SODIUM 200 MG/2ML IV SOLN
INTRAVENOUS | Status: DC | PRN
  Administered 2023-08-28: 200 mg via INTRAVENOUS

## 2023-08-28 MED ORDER — IBUPROFEN 200 MG PO TABS: 600.0000 mg | ORAL_TABLET | Freq: Four times a day (QID) | ORAL | 0 refills | Status: AC

## 2023-08-28 MED ORDER — PROPOFOL 10 MG/ML IV BOLUS
INTRAVENOUS | Status: AC
  Filled 2023-08-28: qty 20

## 2023-08-28 MED ORDER — SUCCINYLCHOLINE CHLORIDE 200 MG/10ML IV SOSY
PREFILLED_SYRINGE | INTRAVENOUS | Status: AC
  Filled 2023-08-28: qty 10

## 2023-08-28 MED ORDER — CHLORHEXIDINE GLUCONATE CLOTH 2 % EX PADS
6.0000 | MEDICATED_PAD | Freq: Once | CUTANEOUS | Status: DC
Start: 2023-08-28 — End: 2023-08-28

## 2023-08-28 MED ORDER — EPHEDRINE 5 MG/ML INJ
INTRAVENOUS | Status: AC
  Filled 2023-08-28: qty 5

## 2023-08-28 MED ORDER — CEFAZOLIN SODIUM-DEXTROSE 2-4 GM/100ML-% IV SOLN
2.0000 g | INTRAVENOUS | Status: AC
  Administered 2023-08-28: 2 g via INTRAVENOUS
  Filled 2023-08-28: qty 100

## 2023-08-28 MED ORDER — FENTANYL CITRATE (PF) 250 MCG/5ML IJ SOLN
INTRAMUSCULAR | Status: DC | PRN
  Administered 2023-08-28 (×3): 50 ug via INTRAVENOUS
  Administered 2023-08-28: 100 ug via INTRAVENOUS

## 2023-08-28 MED ORDER — ROCURONIUM BROMIDE 10 MG/ML (PF) SYRINGE
PREFILLED_SYRINGE | INTRAVENOUS | Status: AC
  Filled 2023-08-28: qty 10

## 2023-08-28 MED ORDER — EPHEDRINE SULFATE-NACL 50-0.9 MG/10ML-% IV SOSY
PREFILLED_SYRINGE | INTRAVENOUS | Status: DC | PRN
  Administered 2023-08-28: 5 mg via INTRAVENOUS

## 2023-08-28 MED ORDER — ROPIVACAINE HCL 5 MG/ML IJ SOLN
INTRAMUSCULAR | Status: DC | PRN
  Administered 2023-08-28 (×2): 15 mL via PERINEURAL

## 2023-08-28 MED ORDER — DEXAMETHASONE SODIUM PHOSPHATE 10 MG/ML IJ SOLN
INTRAMUSCULAR | Status: AC
  Filled 2023-08-28: qty 1

## 2023-08-28 MED ORDER — FENTANYL CITRATE (PF) 100 MCG/2ML IJ SOLN
INTRAMUSCULAR | Status: AC
  Filled 2023-08-28: qty 2

## 2023-08-28 MED ORDER — ACETAMINOPHEN 325 MG PO TABS: 650.0000 mg | ORAL_TABLET | Freq: Four times a day (QID) | ORAL | 0 refills | Status: AC

## 2023-08-28 MED ORDER — ORAL CARE MOUTH RINSE: 15.0000 mL | Freq: Once | OROMUCOSAL | Status: AC

## 2023-08-28 MED ORDER — BUPIVACAINE HCL 0.25 % IJ SOLN
INTRAMUSCULAR | Status: DC | PRN
  Administered 2023-08-28: 16 mL

## 2023-08-28 MED ORDER — DEXAMETHASONE SODIUM PHOSPHATE 10 MG/ML IJ SOLN
INTRAMUSCULAR | Status: DC | PRN
  Administered 2023-08-28: 10 mg via INTRAVENOUS

## 2023-08-28 MED ORDER — ONDANSETRON HCL 4 MG/2ML IJ SOLN
INTRAMUSCULAR | Status: AC
  Filled 2023-08-28: qty 2

## 2023-08-28 MED ORDER — PROPOFOL 10 MG/ML IV BOLUS
INTRAVENOUS | Status: DC | PRN
  Administered 2023-08-28: 150 mg via INTRAVENOUS

## 2023-08-28 MED ORDER — BUPIVACAINE LIPOSOME 1.3 % IJ SUSP
INTRAMUSCULAR | Status: AC
  Filled 2023-08-28: qty 20

## 2023-08-28 MED ORDER — ACETAMINOPHEN 325 MG PO TABS: 650.0000 mg | ORAL_TABLET | ORAL | Status: DC | PRN

## 2023-08-28 MED ORDER — FENTANYL CITRATE (PF) 100 MCG/2ML IJ SOLN
25.0000 ug | INTRAMUSCULAR | Status: DC | PRN
  Administered 2023-08-28: 50 ug via INTRAVENOUS

## 2023-08-28 MED ORDER — LIDOCAINE 2% (20 MG/ML) 5 ML SYRINGE
INTRAMUSCULAR | Status: AC
  Filled 2023-08-28: qty 5

## 2023-08-28 MED ORDER — ACETAMINOPHEN 10 MG/ML IV SOLN
INTRAVENOUS | Status: AC
  Filled 2023-08-28: qty 100

## 2023-08-28 MED ORDER — OXYCODONE HCL 5 MG PO TABS: 5.0000 mg | ORAL_TABLET | ORAL | Status: DC | PRN

## 2023-08-28 MED ORDER — ONDANSETRON HCL 4 MG/2ML IJ SOLN
INTRAMUSCULAR | Status: DC | PRN
  Administered 2023-08-28: 4 mg via INTRAVENOUS

## 2023-08-28 MED ORDER — LACTATED RINGERS IV SOLN
INTRAVENOUS | Status: DC
Start: 2023-08-28 — End: 2023-08-28

## 2023-08-28 MED ORDER — ROCURONIUM BROMIDE 10 MG/ML (PF) SYRINGE
PREFILLED_SYRINGE | INTRAVENOUS | Status: DC | PRN
  Administered 2023-08-28: 20 mg via INTRAVENOUS
  Administered 2023-08-28: 80 mg via INTRAVENOUS

## 2023-08-28 MED ORDER — ACETAMINOPHEN 650 MG RE SUPP: 650.0000 mg | RECTAL | Status: DC | PRN

## 2023-08-28 MED ORDER — CHLORHEXIDINE GLUCONATE 0.12 % MT SOLN
15.0000 mL | Freq: Once | OROMUCOSAL | Status: AC
  Administered 2023-08-28: 15 mL via OROMUCOSAL
  Filled 2023-08-28: qty 15

## 2023-08-28 MED ORDER — 0.9 % SODIUM CHLORIDE (POUR BTL) OPTIME
TOPICAL | Status: DC | PRN
  Administered 2023-08-28: 1000 mL

## 2023-08-28 MED ORDER — MIDAZOLAM HCL 2 MG/2ML IJ SOLN
INTRAMUSCULAR | Status: AC
  Filled 2023-08-28: qty 2

## 2023-08-28 MED ORDER — SODIUM CHLORIDE 0.9% FLUSH
3.0000 mL | Freq: Two times a day (BID) | INTRAVENOUS | Status: DC
Start: 2023-08-28 — End: 2023-08-28

## 2023-08-28 MED ORDER — ONDANSETRON HCL 4 MG/2ML IJ SOLN: 4.0000 mg | Freq: Once | INTRAMUSCULAR | Status: DC | PRN

## 2023-08-28 SURGICAL SUPPLY — 48 items
BAG COUNTER SPONGE SURGICOUNT (BAG) IMPLANT
CHLORAPREP W/TINT 26 (MISCELLANEOUS) ×1 IMPLANT
COVER MAYO STAND STRL (DRAPES) ×1 IMPLANT
COVER SURGICAL LIGHT HANDLE (MISCELLANEOUS) ×1 IMPLANT
COVER TIP SHEARS 8 DVNC (MISCELLANEOUS) ×1 IMPLANT
DEFOGGER SCOPE WARMER CLEARIFY (MISCELLANEOUS) IMPLANT
DERMABOND ADVANCED .7 DNX12 (GAUZE/BANDAGES/DRESSINGS) ×1 IMPLANT
DEVICE TROCAR PUNCTURE CLOSURE (ENDOMECHANICALS) IMPLANT
DRAPE ARM DVNC X/XI (DISPOSABLE) ×4 IMPLANT
DRAPE COLUMN DVNC XI (DISPOSABLE) ×1 IMPLANT
DRAPE CV SPLIT W-CLR ANES SCRN (DRAPES) ×1 IMPLANT
DRAPE SURG ORHT 6 SPLT 77X108 (DRAPES) ×1 IMPLANT
DRIVER NDL MEGA SUTCUT DVNCXI (INSTRUMENTS) ×1 IMPLANT
DRIVER NDLE MEGA SUTCUT DVNCXI (INSTRUMENTS) ×1
ELECT REM PT RETURN 9FT ADLT (ELECTROSURGICAL) ×1
ELECTRODE REM PT RTRN 9FT ADLT (ELECTROSURGICAL) ×1 IMPLANT
FORCEPS BPLR 8 MD DVNC XI (FORCEP) IMPLANT
FORCEPS BPLR FENES DVNC XI (FORCEP) ×1 IMPLANT
GLOVE BIO SURGEON STRL SZ7 (GLOVE) ×1 IMPLANT
GOWN STRL REUS W/ TWL LRG LVL3 (GOWN DISPOSABLE) ×2 IMPLANT
GOWN STRL REUS W/ TWL XL LVL3 (GOWN DISPOSABLE) ×2 IMPLANT
KIT BASIN OR (CUSTOM PROCEDURE TRAY) ×1 IMPLANT
KIT TURNOVER KIT B (KITS) ×1 IMPLANT
MARKER SKIN DUAL TIP RULER LAB (MISCELLANEOUS) ×1 IMPLANT
MESH 3DMAX MID 5X7 LT XLRG (Mesh General) IMPLANT
MESH 3DMAX MID 5X7 RT XLRG (Mesh General) IMPLANT
NDL HYPO 22X1.5 SAFETY MO (MISCELLANEOUS) ×1 IMPLANT
NDL INSUFFLATION 14GA 120MM (NEEDLE) ×1 IMPLANT
NEEDLE HYPO 22X1.5 SAFETY MO (MISCELLANEOUS) ×1
NEEDLE INSUFFLATION 14GA 120MM (NEEDLE) ×1
OBTURATOR OPTICAL STND 8 DVNC (TROCAR)
OBTURATOR OPTICALSTD 8 DVNC (TROCAR) ×1 IMPLANT
PAD ARMBOARD 7.5X6 YLW CONV (MISCELLANEOUS) ×2 IMPLANT
SCISSORS MNPLR CVD DVNC XI (INSTRUMENTS) ×1 IMPLANT
SEAL UNIV 5-12 XI (MISCELLANEOUS) ×3 IMPLANT
SET TUBE SMOKE EVAC HIGH FLOW (TUBING) ×1 IMPLANT
STOPCOCK 4 WAY LG BORE MALE ST (IV SETS) ×1 IMPLANT
SUT MNCRL AB 4-0 PS2 18 (SUTURE) ×1 IMPLANT
SUT STRATA PDS 0 15 CT-1.5 (SUTURE) IMPLANT
SUT STRATA PDS 2-0 23 CT-1 (SUTURE) IMPLANT
SUT VIC AB 3-0 SH 27X BRD (SUTURE) IMPLANT
SUT VIC AB 3-0 SH 27XBRD (SUTURE) ×1 IMPLANT
SUT VLOC 180 2-0 6IN GS21 (SUTURE) IMPLANT
SUT VLOC 180 2-0 9IN GS21 (SUTURE) IMPLANT
SUT VLOC BARB 180 ABS3/0GR12 (SUTURE)
SUTURE VLOC BRB 180 ABS3/0GR12 (SUTURE) IMPLANT
TOWEL GREEN STERILE FF (TOWEL DISPOSABLE) ×1 IMPLANT
TRAY LAPAROSCOPIC MC (CUSTOM PROCEDURE TRAY) ×1 IMPLANT

## 2023-08-28 NOTE — Anesthesia Procedure Notes (Signed)
Anesthesia Regional Block: TAP block   Pre-Anesthetic Checklist: , timeout performed,  Correct Patient, Correct Site, Correct Laterality,  Correct Procedure, Correct Position, site marked,  Risks and benefits discussed,  Surgical consent,  Pre-op evaluation,  At surgeon's request and post-op pain management  Laterality: Right and Left  Prep: chloraprep       Needles:  Injection technique: Single-shot  Needle Type: Echogenic Stimulator Needle     Needle Length: 9cm  Needle Gauge: 21     Additional Needles:   Procedures:,,,, ultrasound used (permanent image in chart),,    Narrative:  Start time: 08/28/2023 7:46 AM End time: 08/28/2023 7:51 AM Injection made incrementally with aspirations every 5 mL.  Performed by: Personally  Anesthesiologist: May Creek Nation, MD  Additional Notes: Discussed risks and benefits of the nerve block in detail, including but not limited vascular injury, permanent nerve damage and infection.   Patient tolerated the procedure well. Local anesthetic introduced in an incremental fashion under minimal resistance after negative aspirations. No paresthesias were elicited. After completion of the procedure, no acute issues were identified and patient continued to be monitored by RN.

## 2023-08-28 NOTE — Transfer of Care (Signed)
Immediate Anesthesia Transfer of Care Note  Patient: CEZAR HENSLER  Procedure(s) Performed: XI ROBOTIC ASSISTED BILATERAL INGUINAL HERNIA WITH MESH (Abdomen)  Patient Location: PACU  Anesthesia Type:General  Level of Consciousness: awake, alert , and oriented  Airway & Oxygen Therapy: Patient Spontanous Breathing and Patient connected to nasal cannula oxygen  Post-op Assessment: Report given to RN and Post -op Vital signs reviewed and stable  Post vital signs: Reviewed and stable  Last Vitals:  Vitals Value Taken Time  BP 85/48 08/28/23 1045  Temp 37.2 C 08/28/23 1030  Pulse 76 08/28/23 1047  Resp 12 08/28/23 1047  SpO2 96 % 08/28/23 1047  Vitals shown include unfiled device data.  Last Pain:  Vitals:   08/28/23 1030  TempSrc:   PainSc: 7       Patients Stated Pain Goal: 2 (08/28/23 0610)  Complications: No notable events documented.

## 2023-08-28 NOTE — Op Note (Signed)
Kenneth [name] ([id number])  Operative Report   Date 08/28/2023  PREOPERATIVE DIAGNOSIS: Bilateral inguinal hernias  POSTOPERATIVE DIAGNOSIS: Same  PROCEDURE:  Robotic Bilateral Inguinal Hernia Repair with Mesh (Bard 3D Mid XL  Polypropylene Mesh x 2)  SURGEON: Melody Haver, MD  ANESTHESIA: General Anesthesia    INTRAOPERATIVE FINDINGS: Large fat-containing indirect hernia on the left. Direct fat-containing hernia on the right.   IMPLANTS: * No implants in log *  ESTIMATED BLOOD LOSS: Minimal   COMPLICATIONS: None  SPECIMENS: None  OPERATIVE INDICATIONS: Pt is a 52 y.o. male who presents with a bilateral inguinal hernia.  The patient desires definitive repair.  The procedure's risks, benefits, and alternatives were explained to the patient.  Risks, including the risks of bleeding, infection, need for mesh removal, and potential for hernia recurrence, were discussed.  The patient agreed to proceed and signed informed consent in front of a witness.   DESCRIPTION OF PROCEDURE: After preoperatively identifying the patient in holding, the patient was brought to the operating room and placed supine on the operating room table.  Both arms were tucked and padded to avoid potential nerve injury.  Sequential Compression Devices were placed bilaterally.  After induction of general anesthesia, the patient was given the appropriate perioperative antibiotics.  The abdomen was prepped and draped in a typical sterile fashion.  A JACHO approved time out, where the name of the patient, the operation, and the intended site were confirmed.  The procedure was begun. The abdomen was accessed with a Veress needle via the standard drop technique in the LUQ at Palmer's point.  Insufflation was established.  An 8 mm optical port was placed under direct visualization in the left lower quadrant.  A camera was introduced into the abdomen, and a thorough inspection of the abdomen was performed to confirm there was  no additional pathology.  Two additional 8 mm robotic ports were placed superior to the umbilicus and in the right lower quadrant.  The patient was then placed into 15-degree Trendelenburg position to facilitate examination of the bilateral inguinal spaces.  A thorough inspection of the abdomen was undertaken.  A large indirect inguinal hernia was identified on the left and a direct hernia on the right. Both were amenable to robotic repair.       The [employer] robot was brought into the field and docked.  I started on the left side. An 8 mm 30-degree scope was placed in the mid-abdominal port.  The peritoneum was taken down 6 cm superior to the hernia from the anterior abdominal wall, and dissection was taken inferiorly towards the hernia.  Medial to the epigastric vessels, the parietal compartment was dissected and completed to visualize the rectus muscle.  This dissection was carried down to the symphysis pubis and obturator foramen.  The retropubic space was dissected to expose at least 2 cm contralateral to the midline.  Cooper's ligament was exposed and cleared at least 2 cm below the ligament to ensure adequate space for the inferior border of the mesh.  Hesselbach's triangle was cleared, assessing for a direct hernia. The direct hernia was reduced, dissecting the contents away from the border of the transversalis fascia.  Any herniated femoral contents were reduced as well.  Lateral to the epigastric vessels, the dissection was carried out into the visceral compartment, continuing in the true preperitoneal plane.  The indirect hernia sac was carefully reduced and separated from the cord structures with medial retraction and a combination of blunt/sharp dissection and  focused cautery.  This dissection continued until the cord structures were parietalized completely, allowing for continuous visualization of the reflected peritoneum with the line originating 2 cm below Cooper's medially and across the psoas  muscle in the lateral compartment.  The internal ring was interrogated for a cord lipoma.  Any component of cord lipoma was reduced to the retroperitoneum and seated dorsal to the preperitoneal mesh.    The same series of steps was repeated on the right side. The right side was notable for a fat-containing direct hernia as well as a cord lipoma.   Having achieved a complete dissection with a critical view of the entire myopectineal orifice, two pieces of Bard 3D midweight XL Polypropylene Mesh were introduced into the field.  They were centered at the iliopubic tract, with the medial side crossing the midline and the inferior edge positioned 2 cm below Cooper's ligament.  With complete coverage of the myopectineal orifice, the inferior edge of the peritoneum was posterior and inferior to the mesh.  The lateral aspect of the mesh extended 3-5 cm beyond the lateral edge of the psoas.  Cephalad retraction of the peritoneal flap did not cause lifting of the inferior mesh edge or cord structures.  The mesh was fixated using an interrupted 3-0 Vicryl suture placed to the ipsilateral Coopers ligament to secure both meshes at the midline as well as to each other.  The peritoneal flaps were closed with a running 3-0 Stratafix barbed suture. Hemostasis was assured in the entirety of the abdomen.  The two lateral ports were removed under direct visualization.  The abdomen was desufflated under direct visualization.  The port sites were closed, local anesthetic was infiltrated, and Dermabond was applied.  After confirming twice that the sponge, needle, and instrument counts were correct, the procedure was terminated, the patient was extubated and transferred to the recovery room in stable condition.     DISPOSITION: Stable to PACU.   Electronically Signed By: Moise Boring 08/28/2023 7:13 AM

## 2023-08-28 NOTE — H&P (Signed)
Kenneth [name] Jan 22, 1971  [id number].    HPI:  52 y/o M w/ bilateral inguinal hernia identified on CT who presents for elective repair.  He reports significant left-sided groin pain but denies any recent changes in health.  ROS: Review of Systems  Constitutional: Negative.   HENT: Negative.    Eyes: Negative.   Respiratory: Negative.    Cardiovascular: Negative.   Gastrointestinal: Negative.   Genitourinary: Negative.   Musculoskeletal: Negative.   Skin: Negative.   Neurological: Negative.   Endo/Heme/Allergies: Negative.   Psychiatric/Behavioral: Negative.      No family history on file.  Past Medical History:  Diagnosis Date   Anxiety    Arthritis    Diverticulitis    GERD (gastroesophageal reflux disease)    Hypertension     Past Surgical History:  Procedure Laterality Date   BACK SURGERY     CHONDROPLASTY Right 04/29/2015   Procedure: CHONDROPLASTY;  Surgeon: [name], MD;  Location: [facility];  Service: Orthopedics;  Laterality: Right;   KNEE ARTHROSCOPY WITH EXCISION PLICA Right 04/29/2015   Procedure: RIGHT KNEE ARTHROSCOPY, EXCISION OF PLICA, CHONDROPLASTY, PARTIAL MEDIAL MENISECTOMY AND PARTIAL LATERAL MENISECTOMY;  Surgeon: [name], MD;  Location: [facility];  Service: Orthopedics;  Laterality: Right;   KNEE ARTHROSCOPY WITH LATERAL MENISECTOMY Right 04/29/2015   Procedure: KNEE ARTHROSCOPY WITH LATERAL MENISECTOMY;  Surgeon: [name], MD;  Location: [facility];  Service: Orthopedics;  Laterality: Right;   KNEE ARTHROSCOPY WITH MEDIAL MENISECTOMY Right 04/29/2015   Procedure: KNEE ARTHROSCOPY WITH MEDIAL MENISECTOMY;  Surgeon: [name], MD;  Location:  SURGERY CENTER;  Service: Orthopedics;  Laterality: Right;   KNEE SURGERY     NASAL SEPTUM SURGERY  2004   SPINAL FUSION     TONSILLECTOMY     as a child    Social History:  reports that he quit smoking about 19 years  ago. His smoking use included cigarettes. He has never used smokeless tobacco. He reports that he does not drink alcohol and does not use drugs.  Allergies: No Known Allergies  Medications Prior to Admission  Medication Sig Dispense Refill   allopurinol (ZYLOPRIM) 300 MG tablet Take 300 mg by mouth daily.     amLODipine (NORVASC) 10 MG tablet Take 10 mg by mouth daily.     chlorthalidone (HYGROTON) 25 MG tablet Take 25 mg by mouth daily.     Cholecalciferol (VITAMIN D) 50 MCG (2000 UT) tablet Take 2,000 Units by mouth daily.      escitalopram (LEXAPRO) 10 MG tablet Take 10 mg by mouth daily.     esomeprazole (NEXIUM) 40 MG capsule Take 40 mg by mouth daily.     famotidine (PEPCID) 20 MG tablet Take 20 mg by mouth daily.     HYDROcodone-acetaminophen (NORCO) 10-325 MG tablet Take 1.5 tablets by mouth 4 (four) times daily.     losartan (COZAAR) 100 MG tablet Take 100 mg by mouth daily.     metoprolol (TOPROL-XL) 100 MG 24 hr tablet Take 100 mg by mouth at bedtime.     Multiple Minerals-Vitamins (CALCIUM-MAGNESIUM-ZINC-D3) TABS Take 1 tablet by mouth daily.     rosuvastatin (CRESTOR) 10 MG tablet Take 10 mg by mouth at bedtime.     baclofen (LIORESAL) 10 MG tablet Take 1 tablet (10 mg total) by mouth 3 (three) times daily. (Patient not taking: No sig reported) 30 each 0   celecoxib (CELEBREX)  200 MG capsule Take 1 capsule (200 mg total) by mouth 2 (two) times daily. (Patient not taking: No sig reported) 30 capsule 0   cyclobenzaprine (FLEXERIL) 10 MG tablet Take 1 tablet (10 mg total) by mouth 3 (three) times daily as needed for muscle spasms. (Patient not taking: No sig reported) 50 tablet 0   docusate sodium (COLACE) 100 MG capsule Take 1 capsule (100 mg total) by mouth 2 (two) times daily. (Patient not taking: No sig reported) 60 capsule 0   oxyCODONE 10 MG TABS Take 1 tablet (10 mg total) by mouth every 3 (three) hours as needed for severe pain ((score 7 to 10)). (Patient not taking: Reported  on 12/27/2020) 30 tablet 0    Physical Exam: Blood pressure 120/80, pulse 68, temperature 98.2 F (36.8 C), temperature source Oral, resp. rate 20, height 5\' 10"  (1.778 m), weight 106.4 kg, SpO2 94%. Gen: male, NAD Abd: soft, non-distended, non-tender  No results found for this or any previous visit (from the past 48 hour(s)). No results found.  Assessment/Plan 52 y/o M w/ bilateral inguinal hernias presenting for elective repair  - Will proceed to the OR.  We discussed the alternatives and potential risks of surgery, including but not limited to: bleeding, infection, damage to bowel or surrounding structures, chronic pain, mesh complications, or recurrent hernia. All questions were addressed and consent was obtained.   - Tentative plan for discharge from PACU  [facility] 08/28/2023, 7:09 AM Please see Amion for pager number during day hours 7:00am-4:30pm or 7:00am -11:30am on weekends

## 2023-08-28 NOTE — Discharge Instructions (Signed)
Home Care After Hernia Repair   Activity  Limit activity for the first 24 hours, then you may return to normal daily activities. Returning to normal daily activities as soon as you can following surgery will enhance recovery time.  No heavy lifting pushing or pulling, anything heavier than 10 pounds (gallon of milk weighs approx. 8.8 pounds) for 4-6 weeks from surgery date.  Do not mow the lawn, use a vacuum cleaner, or do any other strenuous activities without first consulting your surgical team.  Climb stairs slowly and watch your step.  Walk as often as you feel able to increase strength and endurance.  No driving or operating heavy machinery within 24 hours of taking narcotic pain medication.  Diet  Drink plenty of fluids and eat a light meal on the night of surgery. Some patients may find their appetite is poor for a week or two after surgery. This is a normal result of the stress of surgery-your appetite will return in time.   There are no specific diet restrictions after surgery.  Dressings and Wound Care  Keep your wound or incision site clean and dry.  You may have different types of dressings covering your incisions depending on your operation and your surgeon: o Dermabond/Durabond (skin glue): This will usually remain in place for 10-14 days, then naturally fall off your skin. You may take a shower 24 hrs after surgery, carefully wash, not scrub the incision site with a mild non-scented soap. Pat dry with a soft towel.  Do not pick or peel skin glue off..   You can shower and let the water fall on the dressings above. Do not soak or submerge your incision(s) in a bath tub, hot tub, or swimming pool, until your doctor says it is ok to do so or the incision(s) have completely healed, usually about 2-4 weeks.  Do not use creams, powder, salves or balms on your incision(s).    What to Expect After Surgery   Moderate discomfort controlled with medications  Minimal drainage from  incision  You may feel pain in one or both shoulders. This pain comes from the gas still left in your belly after the surgery, if you had laparoscopic surgery (several small incisions). The pain should ease over several days to a week. Ambulation will help with this pain.   Belly swelling  Feeling fatigue and weak  Constipation after surgery is common. Drink plenty fluids and eat a high fiber diet.  Swelling - In some patients might feel that their hernia has returned after surgery-DO NOT Worry this is normal. Swelling may be due to the development of a seroma. Seroma is fluid that has built up where the hernia was repaired this is a normal result after surgery and it will slowly reabsorb back into your body over the next several weeks.   (MALE PATIENTS ONLY)-esp following inguinal hernia repair, it is expected that your scrotum may be slightly swollen or tender. Along with oral NSAIDS medications you can apply ice packs, wear compression shorts, and/or elevate scrotum using a rolled up towel.  Also, a bladder catheter may have been placed during your surgery.  Because of this, it may hurt to urinate for a couple of days or you may pass some blood clots. These issues are expected and will go away after a few days. Please notify surgeon or report to Emergency Dept. if you are unable to urinate or your symptoms worsen.    Pain Control: Prescribed Non-Narcotic Pain Medication  You will be given three prescriptions.  Two of them will be for prescription strength ibuprofen (i.e. Advil) and prescription strength acetaminophen (i.e. Tylenol).  The vast majority of patients will just need these two medications.  One prescription will be for a 'rescue' prescription of an oral narcotic (oxycodone).  You may fill this if needed.  You will alternate taking the ibuprofen (600mg ) every 6 hours and also the acetaminophen (650mg ) every 6 hours so that you are taking one of those medications every 3 hours.  For  example: o 0800 - take ibuprofen 600mg  o 1100 - take acetaminophen 650mg  o 1400 - take ibuprofen 600mg  o 1700 - take acetaminophen 650mg  o Etc.  Continue taking this alternating pattern of ibuprofen and acetaminophen for 3 days  If you cannot take one or the other of these medications, just take the one you can every 6 hours.  If you are comfortable at night, you don't have to wake up and take a medication.  If you are still uncomfortable after taking either ibuprofen or acetaminophen, try gentle stretching exercise and ice packs (a bag of frozen vegetables works great).  If you are still uncomfortable, you may fill the narcotic prescription of Oxycodone and take as directed.  Once you have completed these prescriptions, your pain level should be low enough to stop taking medications altogether or just use an over the counter medication (ibuprofen or acetaminophen) as needed.   Pain Control: Over the Counter Medications to take as needed:  Colace/Docusate: May be prescribed by your surgeon to prevent constipation caused by the combination of narcotics, effects of anesthesia, and decreased ambulation.  Hold for loose stools or diarrhea. Take 100 mg 1-2 times a day starting tonight.   Fiber: High fiber foods, extra liquids (water 9-13 cups/day) can also assist with constipation. Examples of high fiber foods are fruit, bran. Prune juice and water are also good liquids to drink.  Milk of Magnesia/Miralax:  If constipated despite take the over the counter stool softeners, you may take Milk of Magnesia or Miralax as directed on bottle to assist with constipation.     Pepcid/Famotidine: May be prescribed while taking naproxen (Aleve) or other NSAIDs such as ibuprofen (Motrin/Advil) to prevent stomach upset or Acid-reflux symptoms. Take 1 tablet 1-2 times a day.   **Constipation: The first bowel movement may occur anywhere between 1-5 days after surgery.  As Tiano as you are not nauseated or not having  significant abdominal pain this variation is acceptable.  Narcotic pain medications can cause constipation increasing discomfort; early discontinuation will assist with bowel management.If constipated despite taking stool softeners, you may take Milk of Magnesia or Miralax as directed on the bottle.     **Home medications: You may restart your home medications as directed by your respective Primary Care Physician or Surgeon.  When to notify your Doctor or Healthcare Team:   Sign of Wound Infection   Fever over 100 degrees.  Wound becomes extremely swollen, shows red streaks, warm to the touch, and/or drainage from the incision site or foul-smelling drainage.  Wound edges separate or opens up  Bleeding or bruising   If you have bleeding, apply pressure to the site and hold the pressure firmly for 5 minutes. If the bleeding continues, apply pressure again and call 911. If the bleeding stopped, call your doctor to report it.   Call your doctor or nurse if you have increased bleeding from your site and increased bruising or a lump forms or gets  larger under your skin at the site.  Unrelieved Pain    Call your doctor or nurse if your pain gets worse or is not eased 1 hour after taking your pain medicine, or if it is severe and uncontrolled.  Nausea and Vomiting   Call your doctor or nurse if you have nausea and vomiting that continues more than 24 hours, will not let you keep medicine down and will not let you keep fluids down  Fever, Flu-like symptoms   Fever over 100 degrees and/or chills  Gastrointestinal Bleeding Symptoms    Black tarry bowel movements.  This can be normal after surgery on the stomach, but should resolve in a day or two.    Call 911 if you suddenly have signs of blood loss such as:  Vomiting blood  Fast heart rate  Feeling faint, sweaty, or blacking out  Passing bright red blood from your rectum  Blood Clot Symptoms   Tender, swollen or reddened areas in your calf  muscle or thighs.  Numbness or tingling in your lower leg or calf, or at the top of your leg or groin  Skin on your leg looks pale or blue or feels cold to touch  Chest pain or have trouble breathing, lightheadedness, fast heart rate  Sudden Onset of Symptoms    Call 911 if you suddenly have:  Leg weakness and spasm  Loss of bladder or bowel function  Seizure  Confusion, severe headache, dizziness or feeling unsteady, problems talking, difficulty swallowing, and/or numbness or muscle weakness as these could be signs of a stroke.   Follow up Appointment Your follow up appointment should be scheduled 2-3 weeks after your surgery date.  If you have not previously scheduled for a follow-up visit you can be scheduled by contacting [phone].

## 2023-08-28 NOTE — Anesthesia Postprocedure Evaluation (Signed)
Anesthesia Post Note  Patient: Kenneth [name]  Procedure(s) Performed: XI ROBOTIC ASSISTED BILATERAL INGUINAL HERNIA WITH MESH (Abdomen)     Patient location during evaluation: PACU Anesthesia Type: General Level of consciousness: awake and alert Pain management: pain level controlled Vital Signs Assessment: post-procedure vital signs reviewed and stable Respiratory status: spontaneous breathing, nonlabored ventilation, respiratory function stable and patient connected to nasal cannula oxygen Cardiovascular status: blood pressure returned to baseline and stable Postop Assessment: no apparent nausea or vomiting Anesthetic complications: no   No notable events documented.  Last Vitals:  Vitals:   08/28/23 1115 08/28/23 1130  BP: 106/69 102/65  Pulse: 81 81  Resp: 18 20  Temp:  36.8 C  SpO2: 95% 96%    Last Pain:  Vitals:   08/28/23 1100  TempSrc:   PainSc: 6                  Mariann Barter

## 2023-08-28 NOTE — Anesthesia Procedure Notes (Addendum)
Procedure Name: Intubation Date/Time: 08/28/2023 7:42 AM  Performed by: Camillia Herter, CRNAPre-anesthesia Checklist: Patient identified, Emergency Drugs available, Suction available and Patient being monitored Patient Re-evaluated:Patient Re-evaluated prior to induction Oxygen Delivery Method: Circle System Utilized Preoxygenation: Pre-oxygenation with 100% oxygen Induction Type: IV induction Ventilation: Mask ventilation without difficulty Laryngoscope Size: Miller and 2 Grade View: Grade II Tube type: Oral Tube size: 8.0 mm Number of attempts: 1 Airway Equipment and Method: Stylet and Oral airway Placement Confirmation: ETT inserted through vocal cords under direct vision, positive ETCO2 and breath sounds checked- equal and bilateral Secured at: 23 cm Tube secured with: Tape Dental Injury: Teeth and Oropharynx as per pre-operative assessment  Comments: Redundant pharyngeal tissue noted. Glottic opening noted to be deviated slightly to the right. Pt intubated using 8mm OET atraumatically. BBS. +[id number].

## 2023-08-28 NOTE — Anesthesia Preprocedure Evaluation (Addendum)
Anesthesia Evaluation  Patient identified by MRN, date of birth, ID band Patient awake    Reviewed: Allergy & Precautions, NPO status , Patient's Chart, lab work & pertinent test results, reviewed documented beta blocker date and time   History of Anesthesia Complications Negative for: history of anesthetic complications  Airway Mallampati: III  TM Distance: >3 FB Neck ROM: Full    Dental no notable dental hx.    Pulmonary neg COPD, former smoker, neg PE   breath sounds clear to auscultation       Cardiovascular hypertension, (-) CAD, (-) Past MI and (-) Cardiac Stents  Rhythm:Regular Rate:Normal     Neuro/Psych neg Seizures  Anxiety        GI/Hepatic ,GERD  ,,(+) neg Cirrhosis        Endo/Other  neg diabetes    Renal/GU Renal disease     Musculoskeletal  (+) Arthritis ,    Abdominal   Peds  Hematology   Anesthesia Other Findings   Reproductive/Obstetrics                             Anesthesia Physical Anesthesia Plan  ASA: 2  Anesthesia Plan: General   Post-op Pain Management: Regional block*   Induction: Intravenous  PONV Risk Score and Plan: 2 and Ondansetron and Dexamethasone  Airway Management Planned: Oral ETT  Additional Equipment:   Intra-op Plan:   Post-operative Plan: Extubation in OR  Informed Consent: I have reviewed the patients History and Physical, chart, labs and discussed the procedure including the risks, benefits and alternatives for the proposed anesthesia with the patient or authorized representative who has indicated his/her understanding and acceptance.     Dental advisory given  Plan Discussed with: CRNA  Anesthesia Plan Comments:        Anesthesia Quick Evaluation

## 2023-09-18 NOTE — Progress Notes (Signed)
   PROVIDER:  CORDELLA BEVERLEY IDLER, MD  MRN: [id number] DOB: 1971/09/01 DATE OF ENCOUNTER: 09/18/2023 Interval History:     He had some scrotal swelling that has resolved.  He reports mild discomfort but feels much better than he did before surgery and continues to feel better every day.  He has increased his activity levels but has continued to minimize how much he is lifting.  Of note, he reports a longstanding history of reflux with intermittent emesis that happens most frequently in the morning. He has had a barium swallow in the past that he reports was normal (not available for my review).  He initially got some relief with a PPI but now his symptoms have returned and are worse.  He denies dysphagia or weight loss.      Physical Examination:   Physical Exam   Gen: male, NAD Abd: soft, non-distended, nontender, port sites well healed Groin: normal anatomy, no swelling   Assessment and Plan:     Kenneth [name] is a 52 y.o. male who underwent a robotic bilateral inguinal hernia on 08/28/2023. He is recovering as expected and will gradually increase his activity level back to baseline. Given his recurrent reflux, I will refer him to GI. He can follow up with me PRN.  The plan was discussed in detail with the patient today, who expressed understanding.  The patient has my contact information, and understands to call me with any additional questions or concerns in the interval.  I would be happy to see the patient back sooner if the need arises.   GREGORY BEVERLEY IDLER, MD

## 2023-09-21 DIAGNOSIS — M4316 Spondylolisthesis, lumbar region: Secondary | ICD-10-CM | POA: Diagnosis not present

## 2023-11-13 DIAGNOSIS — R0781 Pleurodynia: Secondary | ICD-10-CM | POA: Diagnosis not present

## 2023-11-13 DIAGNOSIS — W19XXXA Unspecified fall, initial encounter: Secondary | ICD-10-CM | POA: Diagnosis not present

## 2023-11-13 DIAGNOSIS — M546 Pain in thoracic spine: Secondary | ICD-10-CM | POA: Diagnosis not present

## 2023-11-22 ENCOUNTER — Other Ambulatory Visit: Payer: Self-pay | Admitting: Internal Medicine

## 2023-11-22 DIAGNOSIS — M5412 Radiculopathy, cervical region: Secondary | ICD-10-CM

## 2023-11-22 DIAGNOSIS — M961 Postlaminectomy syndrome, not elsewhere classified: Secondary | ICD-10-CM | POA: Diagnosis not present

## 2023-12-05 DIAGNOSIS — M5416 Radiculopathy, lumbar region: Secondary | ICD-10-CM | POA: Diagnosis not present

## 2023-12-05 DIAGNOSIS — M4316 Spondylolisthesis, lumbar region: Secondary | ICD-10-CM | POA: Diagnosis not present

## 2023-12-05 DIAGNOSIS — Z9889 Other specified postprocedural states: Secondary | ICD-10-CM | POA: Diagnosis not present

## 2023-12-10 ENCOUNTER — Ambulatory Visit
Admission: RE | Admit: 2023-12-10 | Discharge: 2023-12-10 | Disposition: A | Discharge status: Home / Self Care | Source: Ambulatory Visit | Attending: Internal Medicine | Admitting: Internal Medicine

## 2023-12-10 DIAGNOSIS — M5412 Radiculopathy, cervical region: Secondary | ICD-10-CM

## 2023-12-10 DIAGNOSIS — M542 Cervicalgia: Secondary | ICD-10-CM | POA: Diagnosis not present

## 2023-12-10 DIAGNOSIS — Z981 Arthrodesis status: Secondary | ICD-10-CM | POA: Diagnosis not present

## 2023-12-28 DIAGNOSIS — M25562 Pain in left knee: Secondary | ICD-10-CM | POA: Diagnosis not present

## 2024-02-04 DIAGNOSIS — I10 Essential (primary) hypertension: Secondary | ICD-10-CM | POA: Diagnosis not present

## 2024-02-04 DIAGNOSIS — W57XXXA Bitten or stung by nonvenomous insect and other nonvenomous arthropods, initial encounter: Secondary | ICD-10-CM | POA: Diagnosis not present

## 2024-02-04 DIAGNOSIS — M961 Postlaminectomy syndrome, not elsewhere classified: Secondary | ICD-10-CM | POA: Diagnosis not present

## 2024-02-04 DIAGNOSIS — K047 Periapical abscess without sinus: Secondary | ICD-10-CM | POA: Diagnosis not present

## 2024-02-04 DIAGNOSIS — S80869A Insect bite (nonvenomous), unspecified lower leg, initial encounter: Secondary | ICD-10-CM | POA: Diagnosis not present

## 2024-02-04 DIAGNOSIS — G4709 Other insomnia: Secondary | ICD-10-CM | POA: Diagnosis not present

## 2024-04-07 ENCOUNTER — Other Ambulatory Visit (HOSPITAL_COMMUNITY): Payer: Self-pay | Admitting: Physician Assistant

## 2024-04-07 DIAGNOSIS — K4090 Unilateral inguinal hernia, without obstruction or gangrene, not specified as recurrent: Secondary | ICD-10-CM

## 2024-04-08 ENCOUNTER — Ambulatory Visit (HOSPITAL_COMMUNITY)
Admission: RE | Admit: 2024-04-08 | Discharge: 2024-04-08 | Disposition: A | Discharge status: Home / Self Care | Source: Ambulatory Visit | Attending: Physician Assistant | Admitting: Physician Assistant

## 2024-04-08 DIAGNOSIS — R1032 Left lower quadrant pain: Secondary | ICD-10-CM | POA: Diagnosis not present

## 2024-04-08 DIAGNOSIS — K4090 Unilateral inguinal hernia, without obstruction or gangrene, not specified as recurrent: Secondary | ICD-10-CM | POA: Insufficient documentation

## 2024-04-08 DIAGNOSIS — K5730 Diverticulosis of large intestine without perforation or abscess without bleeding: Secondary | ICD-10-CM | POA: Diagnosis not present

## 2024-04-08 DIAGNOSIS — K429 Umbilical hernia without obstruction or gangrene: Secondary | ICD-10-CM | POA: Diagnosis not present

## 2024-04-08 MED ORDER — IOHEXOL 300 MG/ML  SOLN
100.0000 mL | Freq: Once | INTRAMUSCULAR | Status: AC | PRN
  Administered 2024-04-08: 100 mL via INTRAVENOUS

## 2024-04-17 DIAGNOSIS — I10 Essential (primary) hypertension: Secondary | ICD-10-CM | POA: Diagnosis not present

## 2024-04-17 DIAGNOSIS — E782 Mixed hyperlipidemia: Secondary | ICD-10-CM | POA: Diagnosis not present

## 2024-05-05 DIAGNOSIS — R3915 Urgency of urination: Secondary | ICD-10-CM | POA: Diagnosis not present

## 2024-05-05 DIAGNOSIS — R3912 Poor urinary stream: Secondary | ICD-10-CM | POA: Diagnosis not present

## 2024-05-05 DIAGNOSIS — R3914 Feeling of incomplete bladder emptying: Secondary | ICD-10-CM | POA: Diagnosis not present

## 2024-05-14 DIAGNOSIS — R1032 Left lower quadrant pain: Secondary | ICD-10-CM | POA: Diagnosis not present

## 2024-05-18 DIAGNOSIS — E782 Mixed hyperlipidemia: Secondary | ICD-10-CM | POA: Diagnosis not present

## 2024-05-18 DIAGNOSIS — I10 Essential (primary) hypertension: Secondary | ICD-10-CM | POA: Diagnosis not present

## 2024-05-28 DIAGNOSIS — M961 Postlaminectomy syndrome, not elsewhere classified: Secondary | ICD-10-CM | POA: Diagnosis not present

## 2024-05-28 DIAGNOSIS — Z79899 Other long term (current) drug therapy: Secondary | ICD-10-CM | POA: Diagnosis not present

## 2024-05-28 DIAGNOSIS — E559 Vitamin D deficiency, unspecified: Secondary | ICD-10-CM | POA: Diagnosis not present

## 2024-05-28 DIAGNOSIS — Z8739 Personal history of other diseases of the musculoskeletal system and connective tissue: Secondary | ICD-10-CM | POA: Diagnosis not present

## 2024-05-28 DIAGNOSIS — Z0001 Encounter for general adult medical examination with abnormal findings: Secondary | ICD-10-CM | POA: Diagnosis not present

## 2024-05-28 DIAGNOSIS — E782 Mixed hyperlipidemia: Secondary | ICD-10-CM | POA: Diagnosis not present

## 2024-05-28 DIAGNOSIS — I10 Essential (primary) hypertension: Secondary | ICD-10-CM | POA: Diagnosis not present

## 2024-05-28 DIAGNOSIS — R7303 Prediabetes: Secondary | ICD-10-CM | POA: Diagnosis not present

## 2024-06-17 DIAGNOSIS — E782 Mixed hyperlipidemia: Secondary | ICD-10-CM | POA: Diagnosis not present

## 2024-06-17 DIAGNOSIS — I10 Essential (primary) hypertension: Secondary | ICD-10-CM | POA: Diagnosis not present
